# Patient Record
Sex: Female | Born: 1940
Health system: Southern US, Community
[De-identification: ages and names within clinical notes are randomized; demographics above are authoritative.]

## PROBLEM LIST (undated history)

## (undated) DIAGNOSIS — I739 Peripheral vascular disease, unspecified: Secondary | ICD-10-CM

## (undated) DIAGNOSIS — J189 Pneumonia, unspecified organism: Secondary | ICD-10-CM

## (undated) DIAGNOSIS — D219 Benign neoplasm of connective and other soft tissue, unspecified: Secondary | ICD-10-CM

## (undated) DIAGNOSIS — G4730 Sleep apnea, unspecified: Secondary | ICD-10-CM

## (undated) DIAGNOSIS — E559 Vitamin D deficiency, unspecified: Secondary | ICD-10-CM

## (undated) DIAGNOSIS — G4733 Obstructive sleep apnea (adult) (pediatric): Secondary | ICD-10-CM

## (undated) DIAGNOSIS — K5790 Diverticulosis of intestine, part unspecified, without perforation or abscess without bleeding: Secondary | ICD-10-CM

## (undated) DIAGNOSIS — E7800 Pure hypercholesterolemia, unspecified: Secondary | ICD-10-CM

## (undated) DIAGNOSIS — I10 Essential (primary) hypertension: Secondary | ICD-10-CM

## (undated) DIAGNOSIS — M1990 Unspecified osteoarthritis, unspecified site: Secondary | ICD-10-CM

## (undated) DIAGNOSIS — M8580 Other specified disorders of bone density and structure, unspecified site: Secondary | ICD-10-CM

## (undated) DIAGNOSIS — N3010 Interstitial cystitis (chronic) without hematuria: Secondary | ICD-10-CM

## (undated) DIAGNOSIS — I499 Cardiac arrhythmia, unspecified: Secondary | ICD-10-CM

## (undated) HISTORY — DX: Vitamin D deficiency, unspecified: E55.9

## (undated) HISTORY — DX: Other specified disorders of bone density and structure, unspecified site: M85.80

## (undated) HISTORY — PX: CARDIAC ELECTROPHYSIOLOGY STUDY AND ABLATION: SHX1294

## (undated) HISTORY — DX: Sleep apnea, unspecified: G47.30

## (undated) HISTORY — PX: BREAST SURGERY: SHX581

## (undated) HISTORY — PX: OOPHORECTOMY: SHX86

## (undated) HISTORY — PX: EYE SURGERY: SHX253

## (undated) HISTORY — DX: Peripheral vascular disease, unspecified: I73.9

## (undated) HISTORY — PX: HEMORRHOID SURGERY: SHX153

## (undated) HISTORY — PX: APPENDECTOMY: SHX54

## (undated) HISTORY — PX: ABDOMINAL HYSTERECTOMY: SHX81

## (undated) HISTORY — DX: Interstitial cystitis (chronic) without hematuria: N30.10

## (undated) HISTORY — DX: Benign neoplasm of connective and other soft tissue, unspecified: D21.9

## (undated) HISTORY — DX: Diverticulosis of intestine, part unspecified, without perforation or abscess without bleeding: K57.90

## (undated) HISTORY — DX: Obstructive sleep apnea (adult) (pediatric): G47.33

## (undated) HISTORY — PX: TONSILLECTOMY: SHX5217

## (undated) HISTORY — DX: Essential (primary) hypertension: I10

## (undated) HISTORY — DX: Pure hypercholesterolemia, unspecified: E78.00

## (undated) HISTORY — DX: Unspecified osteoarthritis, unspecified site: M19.90

---

## 1997-09-19 ENCOUNTER — Emergency Department (HOSPITAL_COMMUNITY): Admission: EM | Admit: 1997-09-19 | Discharge: 1997-09-19 | Payer: Self-pay | Admitting: Emergency Medicine

## 1997-12-14 ENCOUNTER — Encounter: Payer: Self-pay | Admitting: Emergency Medicine

## 1997-12-14 ENCOUNTER — Emergency Department (HOSPITAL_COMMUNITY): Admission: EM | Admit: 1997-12-14 | Discharge: 1997-12-14 | Payer: Self-pay | Admitting: Emergency Medicine

## 1998-09-15 ENCOUNTER — Other Ambulatory Visit: Admission: RE | Admit: 1998-09-15 | Discharge: 1998-09-15 | Payer: Self-pay | Admitting: Obstetrics and Gynecology

## 1999-02-15 ENCOUNTER — Emergency Department (HOSPITAL_COMMUNITY): Admission: EM | Admit: 1999-02-15 | Discharge: 1999-02-15 | Payer: Self-pay | Admitting: Emergency Medicine

## 2000-01-13 ENCOUNTER — Other Ambulatory Visit: Admission: RE | Admit: 2000-01-13 | Discharge: 2000-01-13 | Payer: Self-pay | Admitting: Obstetrics and Gynecology

## 2001-01-23 ENCOUNTER — Other Ambulatory Visit: Admission: RE | Admit: 2001-01-23 | Discharge: 2001-01-23 | Payer: Self-pay | Admitting: Obstetrics and Gynecology

## 2002-06-27 ENCOUNTER — Emergency Department (HOSPITAL_COMMUNITY): Admission: EM | Admit: 2002-06-27 | Discharge: 2002-06-27 | Payer: Self-pay | Admitting: Emergency Medicine

## 2002-07-01 ENCOUNTER — Emergency Department (HOSPITAL_COMMUNITY): Admission: EM | Admit: 2002-07-01 | Discharge: 2002-07-01 | Payer: Self-pay

## 2002-07-28 ENCOUNTER — Emergency Department (HOSPITAL_COMMUNITY): Admission: EM | Admit: 2002-07-28 | Discharge: 2002-07-28 | Payer: Self-pay | Admitting: Emergency Medicine

## 2002-07-28 ENCOUNTER — Emergency Department (HOSPITAL_COMMUNITY): Admission: EM | Admit: 2002-07-28 | Discharge: 2002-07-28 | Payer: Self-pay | Admitting: *Deleted

## 2002-07-28 ENCOUNTER — Encounter: Payer: Self-pay | Admitting: Emergency Medicine

## 2002-08-06 ENCOUNTER — Ambulatory Visit (HOSPITAL_COMMUNITY): Admission: RE | Admit: 2002-08-06 | Discharge: 2002-08-06 | Payer: Self-pay | Admitting: Cardiology

## 2002-08-06 ENCOUNTER — Encounter: Payer: Self-pay | Admitting: Cardiology

## 2002-08-24 ENCOUNTER — Ambulatory Visit (HOSPITAL_COMMUNITY): Admission: RE | Admit: 2002-08-24 | Discharge: 2002-08-25 | Payer: Self-pay | Admitting: Internal Medicine

## 2002-08-29 ENCOUNTER — Ambulatory Visit (HOSPITAL_COMMUNITY): Admission: RE | Admit: 2002-08-29 | Discharge: 2002-08-29 | Payer: Self-pay | Admitting: Internal Medicine

## 2002-08-29 ENCOUNTER — Encounter: Payer: Self-pay | Admitting: Internal Medicine

## 2002-12-31 ENCOUNTER — Other Ambulatory Visit: Admission: RE | Admit: 2002-12-31 | Discharge: 2002-12-31 | Payer: Self-pay | Admitting: Obstetrics and Gynecology

## 2003-12-31 ENCOUNTER — Ambulatory Visit: Payer: Self-pay | Admitting: Gastroenterology

## 2004-01-06 ENCOUNTER — Other Ambulatory Visit: Admission: RE | Admit: 2004-01-06 | Discharge: 2004-01-06 | Payer: Self-pay | Admitting: Obstetrics and Gynecology

## 2004-01-09 ENCOUNTER — Ambulatory Visit: Payer: Self-pay | Admitting: Gastroenterology

## 2004-11-24 ENCOUNTER — Ambulatory Visit: Payer: Self-pay | Admitting: Pulmonary Disease

## 2005-01-25 ENCOUNTER — Ambulatory Visit: Payer: Self-pay | Admitting: Pulmonary Disease

## 2005-01-26 ENCOUNTER — Ambulatory Visit: Payer: Self-pay | Admitting: Cardiovascular Disease

## 2005-01-29 ENCOUNTER — Ambulatory Visit (HOSPITAL_COMMUNITY): Admission: RE | Admit: 2005-01-29 | Discharge: 2005-01-29 | Payer: Self-pay | Admitting: Urology

## 2005-02-15 ENCOUNTER — Other Ambulatory Visit: Admission: RE | Admit: 2005-02-15 | Discharge: 2005-02-15 | Payer: Self-pay | Admitting: Obstetrics and Gynecology

## 2005-02-17 ENCOUNTER — Ambulatory Visit (HOSPITAL_BASED_OUTPATIENT_CLINIC_OR_DEPARTMENT_OTHER): Admission: RE | Admit: 2005-02-17 | Discharge: 2005-02-17 | Payer: Self-pay | Admitting: Urology

## 2005-02-17 ENCOUNTER — Encounter (INDEPENDENT_AMBULATORY_CARE_PROVIDER_SITE_OTHER): Payer: Self-pay | Admitting: Specialist

## 2005-07-21 ENCOUNTER — Ambulatory Visit: Payer: Self-pay | Admitting: Pulmonary Disease

## 2006-08-29 ENCOUNTER — Other Ambulatory Visit: Admission: RE | Admit: 2006-08-29 | Discharge: 2006-08-29 | Payer: Self-pay | Admitting: Obstetrics and Gynecology

## 2006-09-07 ENCOUNTER — Ambulatory Visit (HOSPITAL_COMMUNITY): Admission: RE | Admit: 2006-09-07 | Discharge: 2006-09-07 | Payer: Self-pay | Admitting: Cardiology

## 2007-04-03 ENCOUNTER — Ambulatory Visit: Payer: Self-pay | Admitting: Pulmonary Disease

## 2007-04-04 DIAGNOSIS — I10 Essential (primary) hypertension: Secondary | ICD-10-CM | POA: Insufficient documentation

## 2007-04-04 DIAGNOSIS — M810 Age-related osteoporosis without current pathological fracture: Secondary | ICD-10-CM | POA: Insufficient documentation

## 2007-04-04 DIAGNOSIS — M1990 Unspecified osteoarthritis, unspecified site: Secondary | ICD-10-CM | POA: Insufficient documentation

## 2007-05-09 ENCOUNTER — Telehealth (INDEPENDENT_AMBULATORY_CARE_PROVIDER_SITE_OTHER): Payer: Self-pay | Admitting: *Deleted

## 2007-08-30 ENCOUNTER — Other Ambulatory Visit: Admission: RE | Admit: 2007-08-30 | Discharge: 2007-08-30 | Payer: Self-pay | Admitting: Obstetrics and Gynecology

## 2007-12-11 ENCOUNTER — Ambulatory Visit: Payer: Self-pay | Admitting: Pulmonary Disease

## 2008-02-12 ENCOUNTER — Telehealth (INDEPENDENT_AMBULATORY_CARE_PROVIDER_SITE_OTHER): Payer: Self-pay | Admitting: *Deleted

## 2008-03-11 ENCOUNTER — Telehealth (INDEPENDENT_AMBULATORY_CARE_PROVIDER_SITE_OTHER): Payer: Self-pay | Admitting: *Deleted

## 2008-03-25 ENCOUNTER — Ambulatory Visit: Payer: Self-pay | Admitting: Obstetrics and Gynecology

## 2008-03-26 ENCOUNTER — Ambulatory Visit: Payer: Self-pay | Admitting: Obstetrics and Gynecology

## 2008-12-13 ENCOUNTER — Encounter (INDEPENDENT_AMBULATORY_CARE_PROVIDER_SITE_OTHER): Payer: Self-pay | Admitting: *Deleted

## 2009-01-08 ENCOUNTER — Encounter (INDEPENDENT_AMBULATORY_CARE_PROVIDER_SITE_OTHER): Payer: Self-pay | Admitting: *Deleted

## 2009-01-29 ENCOUNTER — Encounter (INDEPENDENT_AMBULATORY_CARE_PROVIDER_SITE_OTHER): Payer: Self-pay | Admitting: *Deleted

## 2009-02-04 ENCOUNTER — Ambulatory Visit: Payer: Self-pay | Admitting: Gastroenterology

## 2009-02-13 ENCOUNTER — Ambulatory Visit: Payer: Self-pay | Admitting: Gastroenterology

## 2009-02-20 ENCOUNTER — Ambulatory Visit: Payer: Self-pay | Admitting: Pulmonary Disease

## 2009-10-29 ENCOUNTER — Ambulatory Visit: Payer: Self-pay | Admitting: Pulmonary Disease

## 2009-11-10 ENCOUNTER — Ambulatory Visit: Payer: Self-pay | Admitting: Pulmonary Disease

## 2009-11-10 ENCOUNTER — Telehealth (INDEPENDENT_AMBULATORY_CARE_PROVIDER_SITE_OTHER): Payer: Self-pay | Admitting: *Deleted

## 2009-11-10 DIAGNOSIS — J209 Acute bronchitis, unspecified: Secondary | ICD-10-CM | POA: Insufficient documentation

## 2009-11-10 DIAGNOSIS — N281 Cyst of kidney, acquired: Secondary | ICD-10-CM | POA: Insufficient documentation

## 2009-11-10 DIAGNOSIS — N3010 Interstitial cystitis (chronic) without hematuria: Secondary | ICD-10-CM | POA: Insufficient documentation

## 2009-11-10 DIAGNOSIS — H0520 Unspecified exophthalmos: Secondary | ICD-10-CM | POA: Insufficient documentation

## 2009-11-10 DIAGNOSIS — R059 Cough, unspecified: Secondary | ICD-10-CM | POA: Insufficient documentation

## 2009-11-10 DIAGNOSIS — K5730 Diverticulosis of large intestine without perforation or abscess without bleeding: Secondary | ICD-10-CM | POA: Insufficient documentation

## 2009-11-10 DIAGNOSIS — I498 Other specified cardiac arrhythmias: Secondary | ICD-10-CM | POA: Insufficient documentation

## 2009-11-10 DIAGNOSIS — R05 Cough: Secondary | ICD-10-CM | POA: Insufficient documentation

## 2009-11-10 DIAGNOSIS — M545 Low back pain, unspecified: Secondary | ICD-10-CM | POA: Insufficient documentation

## 2009-11-10 DIAGNOSIS — E7800 Pure hypercholesterolemia, unspecified: Secondary | ICD-10-CM | POA: Insufficient documentation

## 2009-11-12 ENCOUNTER — Telehealth: Payer: Self-pay | Admitting: Pulmonary Disease

## 2009-11-13 LAB — CONVERTED CEMR LAB
AST: 18 units/L (ref 0–37)
BUN: 18 mg/dL (ref 6–23)
CO2: 26 meq/L (ref 19–32)
Calcium: 9 mg/dL (ref 8.4–10.5)
Chloride: 106 meq/L (ref 96–112)
Creatinine, Ser: 1 mg/dL (ref 0.4–1.2)
Eosinophils Absolute: 0.2 10*3/uL (ref 0.0–0.7)
GFR calc non Af Amer: 67.97 mL/min (ref >60)
Hemoglobin: 12.7 g/dL (ref 12.0–15.0)
Lymphocytes Relative: 32.1 % (ref 12.0–46.0)
Lymphs Abs: 2 10*3/uL (ref 0.7–4.0)
MCHC: 34.3 g/dL (ref 30.0–36.0)
MCV: 91.8 fL (ref 78.0–100.0)
Neutro Abs: 3.3 10*3/uL (ref 1.4–7.7)
Neutrophils Relative %: 52.3 % (ref 43.0–77.0)
Potassium: 4.2 meq/L (ref 3.5–5.1)
RBC: 4.03 M/uL (ref 3.87–5.11)
Sed Rate: 25 mm/hr — ABNORMAL HIGH (ref 0–22)
Sodium: 140 meq/L (ref 135–145)
Total Bilirubin: 0.3 mg/dL (ref 0.3–1.2)

## 2010-02-11 ENCOUNTER — Other Ambulatory Visit
Admission: RE | Admit: 2010-02-11 | Discharge: 2010-02-11 | Payer: Self-pay | Source: Home / Self Care | Admitting: Obstetrics and Gynecology

## 2010-02-11 ENCOUNTER — Ambulatory Visit
Admission: RE | Admit: 2010-02-11 | Discharge: 2010-02-11 | Payer: Self-pay | Source: Home / Self Care | Attending: Obstetrics and Gynecology | Admitting: Obstetrics and Gynecology

## 2010-02-28 ENCOUNTER — Encounter: Payer: Self-pay | Admitting: Pulmonary Disease

## 2010-03-12 NOTE — Procedures (Signed)
Summary: Colonoscopy  Patient: Regina [name] Note: All result statuses are Final unless otherwise noted.  Tests: (1) Colonoscopy (COL)   COL Colonoscopy           DONE     [facility]     [address]     [place], [place]  [address]           COLONOSCOPY PROCEDURE REPORT           PATIENT:  [name], [name]  MR#:  [id number]     BIRTHDATE:  1940-07-12, 68 yrs. old  GENDER:  female           ENDOSCOPIST:  Judie Petit T. Russella Dar, MD, [facility]           PROCEDURE DATE:  02/13/2009     PROCEDURE:  Higher-risk screening colonoscopy G0105           ASA CLASS:  Class II     INDICATIONS:  1) Elevated Risk Screening  2) family Hx of polyps:     father and sister with polyps in their 45s.           MEDICATIONS:   Fentanyl 100 mcg IV, Versed 10 mg IV           DESCRIPTION OF PROCEDURE:   After the risks benefits and     alternatives of the procedure were thoroughly explained, informed     consent was obtained.  Digital rectal exam was performed and     revealed no abnormalities.   The LB [id number]-[id number] endoscope     was introduced through the anus and advanced to the cecum, which     was identified by both the appendix and ileocecal valve, without     limitations.  The quality of the prep was excellent, using     MoviPrep.  The instrument was then slowly withdrawn as the colon     was fully examined.     <<PROCEDUREIMAGES>>           FINDINGS:  Mild diverticulosis was found sigmoid to descending     colon.  A normal appearing cecum, ileocecal valve, and appendiceal     orifice were identified. The ascending, hepatic flexure,     transverse, splenic flexure, and rectum appeared unremarkable.     Retroflexed views in the rectum revealed internal hemorrhoids,     small.  The time to cecum =  2.5  minutes. The scope was then     withdrawn (time =  7.5  min) from the patient and the procedure     completed.           COMPLICATIONS:  None           ENDOSCOPIC IMPRESSION:     1)  Mild diverticulosis     2) Internal hemorrhoids           RECOMMENDATIONS:     1) high fiber diet     2) Given your family history, you should have a repeat     colonoscopy in 5 years           Carrah Eppolito T. Russella Dar, MD, [facility]           CC: Michele Mcalpine, MD           n.     Rosalie DoctorVenita Lick. Krithika Tome at 02/13/2009 09:45 AM           [place], [place], [id number]  Note: An exclamation mark Marland Kitchen)  indicates a result that was not dispersed into the flowsheet. Document Creation Date: 02/13/2009 1:00 PM _______________________________________________________________________  (1) Order result status: Final Collection or observation date-time: 02/13/2009 09:40 Requested date-time:  Receipt date-time:  Reported date-time:  Referring Physician:   Ordering Physician: Claudette Head [phone]) Specimen Source:  Source: Launa Grill Order Number: [id number] Lab site:   Appended Document: Colonoscopy    Clinical Lists Changes  Observations: Added new observation of COLONNXTDUE: 02/2014 (02/13/2009 13:10)

## 2010-03-12 NOTE — Assessment & Plan Note (Signed)
Summary: flu shot/mhh  Nurse Visit   Allergies: No Known Drug Allergies  Immunizations Administered:  Influenza Vaccine # 1:    Vaccine Type: Fluvax MCR    Site: left deltoid    Mfr: GlaxoSmithKline    Dose: 0.5 ml    Route: IM    Given by: Kandice Hams CMA    Exp. Date: 08/08/2010    Lot #: [id number]    VIS given: 09/02/09 version given October 29, 2009.  Flu Vaccine Consent Questions:    Do you have a history of severe allergic reactions to this vaccine? no    Any prior history of allergic reactions to egg and/or gelatin? no    Do you have a sensitivity to the preservative Thimersol? no    Do you have a past history of Guillan-Barre Syndrome? no    Do you currently have an acute febrile illness? no    Have you ever had a severe reaction to latex? no    Vaccine information given and explained to patient? yes    Are you currently pregnant? no  Orders Added: 1)  Influenza Vaccine MCR [00025]

## 2010-03-12 NOTE — Progress Notes (Signed)
Summary: side effects    Phone Note Call from Patient Call back at [phone]   Caller: Patient Call For: nadel Summary of Call: pt having side effects from prednisone. Initial call taken by: Rickard Patience,  November 12, 2009 8:14 AM  Follow-up for Phone Call        [facility] at both home and alternate # for pt TCBx1.   Aundra Millet Reynolds LPN  November 12, 2009 9:22 AM   called and spoke with pt.  pt states she was just seen by SN on 11/10/2009.  Pt was given rx for Pred pak and Tussionex.  Pt c/o acid reflux, upset stomach and indigestion since starting the Prednisone.  Pt denied any n/v or diarrhea or constipation.  Pt states she is taking Prednisone without food.  I recommend she try taking prednisone with food and see if that helps Sx but pt still wanted SN's recs.  Please advise.  Thanks.  Aundra Millet Reynolds LPN  November 12, 2009 9:35 AM   Additional Follow-up for Phone Call Additional follow up Details #1::        per SN---please take the prednisone with food.  ok to use the prilosec otc   1 by mouth two times a day 30 mns prior to breakfast and dinner.  thanks Randell Loop CMA  November 12, 2009 2:49 PM   pt advised. Carron Curie CMA  November 12, 2009 3:00 PM

## 2010-03-12 NOTE — Assessment & Plan Note (Signed)
Summary: cough/chest hurts/ok per TD/mg   CC:  4 year ROV & add-on for cough....  History of Present Illness: 70 y/o BF, whom I follow for respiratory problems only as she sees [facility] for Cardiovasc & general medical needs...   ~  November 10, 2009:  she was last seen by me 6/07 for a similar illness/ URI/ AB treated w/ Medrol Dosepak & Tussionex at that time... presents today w/ 2 week hx URI "cold" she says characterized by runny nose, congestion, drainage, cough w/ yellow sputum, +f +c +s> for which she took OTC meds w/ some improvement... but the cough persists- now mostly dry, no sputum, no hemoptysis, & assoc w/ some chest discomfort & sl SOB... we discussed checking CXR (clear) & approp blood work for completeness (all essen WNL) & Rx w/ Depo, Dosepak, Tussionex...    Current Problems:   ACUTE BRONCHITIS (ICD-466.0) & COUGH (ICD-786.2) - ** AS ABOVE **  ~  CXR 10/06 showed clear lungs, normal heart size, WNL.Marland Kitchen.  ~  CXR 10/11 showed clear lungs, NAD...  Hx of PROPTOSIS (ICD-376.30) - she has sl proptosis noted x yrs w/o change... TFT's have been repeatedly WNL.Marland Kitchen. she denies ophthal problems...  HYPERTENSION (ICD-401.9) - she is under the care of DrHarwani & currently taking TOPROL XL 25mg /d, & AMLODIPINE 5mg Bid... BP today= 120/86 and she denies HA, fatigue, visual changes, CP, palipit, dizziness, syncope, dyspnea, edema, etc...   Hx of SUPRAVENTRICULAR TACHYCARDIA (ICD-427.89) - hx recurrent SVT in the past w/ full eval from DrTaylor in 2004 w/ RF catheter ablation of AV nodal reentrant tachycardia 7/04 w/ good result... she has been under the care of [facility] since 2006...  HYPERCHOLESTEROLEMIA (ICD-272.0) - currently taking CRESTOR 20mg /d from [facility] who does her labs as well...   DIVERTICULOSIS OF COLON (ICD-562.10)  ~  colonoscopy 12/05 by DrStark showed divertics, otherw neg... he rec f/u 89yrs due to fam hx polyps.  ~  colonoscopy 1/11 by DrStark showed divertics, &  hems... he rec f/u 65yrs again.  Hx of RENAL CYST (ICD-593.2) & Hx of INTERSTITIAL CYSTITIS (ICD-595.1) - prev eval & Rx by DrPeterson w/ CT/ MRI/ Cysto in 2006-7 & everything appeared benign...   DEGENERATIVE JOINT DISEASE (ICD-715.90) & Hx of BACK PAIN, LUMBAR (ICD-724.2) - prev eval & Rx by drPaul for Ortho, she currently denies back discomfort or signif arthritic limitations...  OSTEOPOROSIS (ICD-733.00) - follwed by DrGottsegen for GYN...   Preventive Screening-Counseling & Management  Alcohol-Tobacco     Smoking Status: never  Allergies (verified): No Known Drug Allergies  Comments:  Nurse/Medical Assistant: The patient's medications and allergies were reviewed with the patient and were updated in the Medication and Allergy Lists.  Past History:  Past Medical History: Hx of PROPTOSIS (ICD-376.30) HYPERTENSION (ICD-401.9) Hx of SUPRAVENTRICULAR TACHYCARDIA (ICD-427.89) HYPERCHOLESTEROLEMIA (ICD-272.0) DIVERTICULOSIS OF COLON (ICD-562.10) Hx of RENAL CYST (ICD-593.2) Hx of INTERSTITIAL CYSTITIS (ICD-595.1) DEGENERATIVE JOINT DISEASE (ICD-715.90) Hx of BACK PAIN, LUMBAR (ICD-724.2) OSTEOPOROSIS (ICD-733.00)  Past Surgical History: S/P appendectomy 1952 S/P T & A 1962 S/P left breast lumpectomy in 1963- benign S/P hysterectomy 1984 S/P hemorroid surg S/P RF catheter ablation for SVT in 2004  Family History: Reviewed history and no changes required. Father died from Myeloma Mother died from stroke 4 Siblings- 1 Bro has DM  Social History: Reviewed history and no changes required. Widow Children Never smoked No alcohol retired Public librarian  Review of Systems       The patient complains of fever, chills, sweats, malaise,  nasal congestion, dyspnea on exertion, cough, wheezing, stiffness, and arthritis.  The patient denies anorexia, fatigue, weakness, weight loss, sleep disorder, blurring, diplopia, eye irritation, eye discharge, vision loss, eye pain,  photophobia, earache, ear discharge, tinnitus, decreased hearing, nosebleeds, sore throat, hoarseness, chest pain, palpitations, syncope, orthopnea, PND, peripheral edema, dyspnea at rest, excessive sputum, hemoptysis, pleurisy, nausea, vomiting, diarrhea, constipation, change in bowel habits, abdominal pain, melena, hematochezia, jaundice, gas/bloating, indigestion/heartburn, dysphagia, odynophagia, dysuria, hematuria, urinary frequency, urinary hesitancy, nocturia, incontinence, back pain, joint pain, joint swelling, muscle cramps, muscle weakness, sciatica, restless legs, leg pain at night, leg pain with exertion, rash, itching, dryness, suspicious lesions, paralysis, paresthesias, seizures, tremors, vertigo, transient blindness, frequent falls, frequent headaches, difficulty walking, depression, anxiety, memory loss, confusion, cold intolerance, heat intolerance, polydipsia, polyphagia, polyuria, unusual weight change, abnormal bruising, bleeding, enlarged lymph nodes, urticaria, allergic rash, hay fever, and recurrent infections.    Vital Signs:  Patient profile:   70 year old female Height:      66 inches Weight:      180.50 pounds BMI:     29.24 O2 Sat:      96 % on Room air Temp:     100.3 degrees F oral Pulse rate:   63 / minute BP sitting:   120 / 86  (right arm) Cuff size:   regular  Vitals Entered By: Randell Loop CMA (November 10, 2009 3:30 PM)  O2 Sat at Rest %:  96 O2 Flow:  Room air CC: 4 year ROV & add-on for cough... Is Patient Diabetic? No Pain Assessment Patient in pain? no      Comments meds updated today with pt   Physical Exam  Additional Exam:  WD, sl overweight, 69 y/o BF in NAD... GENERAL:  Alert & oriented; pleasant & cooperative... HEENT:  [place]/AT, EOM-wnl, PERRLA, EACs-clear, TMs-wnl, NOSE- sl congested, THROAT-clear & wnl. NECK:  Supple w/ fairROM; no JVD; normal carotid impulses w/o bruits; no thyromegaly or nodules palpated; no lymphadenopathy. CHEST:   Clear to P & A x few scat rhonchi, no wheezes/ rales/ consolidation... HEART:  Regular Rhythm;  gr1/6 SEM w/o rubs or gallops detected... ABDOMEN:  Soft & nontender; normal bowel sounds; no organomegaly or masses palpated... EXT: mild arthritic changes; no varicose veins/ +venous insuffic/ no edema. NEURO:  CN's intact; no focal neuro abnormalities... DERM:  No lesions noted; no rash etc...    CXR  Procedure date:  11/10/2009  Findings:      CHEST - 2 VIEW Comparison: Chest x-ray of 11/24/2004   Findings: - The lungs are clear and slightly hyperaerated. Mediastinal contours appear stable.  The heart is within upper limits of normal.  No acute bony abnormality is seen.   IMPRESSION: Stable slight hyperaeration.  No active lung disease.   Read By:  Juline Patch,  M.D.   MISC. Report  Procedure date:  11/10/2009  Findings:      CBC Platelet w/Diff (CBCD)   White Cell Count          6.3 K/uL                    4.5-10.5   Red Cell Count            4.03 Mil/uL                 3.87-5.11   Hemoglobin                12.7 g/dL  12.0-15.0   Hematocrit                37.0 %                      36.0-46.0   MCV                       91.8 fl                     78.0-100.0   Platelet Count            195.0 K/uL                  150.0-400.0   Neutrophil %              52.3 %                      43.0-77.0   Lymphocyte %              32.1 %                      12.0-46.0   Monocyte %                10.2 %                      3.0-12.0   Eosinophils%              2.9 %                       0.0-5.0   Basophils %               2.5 %                       0.0-3.0  BMP (METABOL)   Sodium                    140 mEq/L                   135-145   Potassium                 4.2 mEq/L                   3.5-5.1   Chloride                  106 mEq/L                   96-112   Carbon Dioxide            26 mEq/L                    19-32   Glucose                   90 mg/dL                     16-10   BUN                       18 mg/dL                    9-60   Creatinine  1.0 mg/dL                   1.6-1.0   Calcium                   9.0 mg/dL                   9.6-04.5   GFR                       67.97 mL/min                >60  Hepatic/Liver Function Panel (HEPATIC)   Total Bilirubin           0.3 mg/dL                   4.0-9.8   Direct Bilirubin          0.1 mg/dL                   1.1-9.1   Alkaline Phosphatase      47 U/L                      39-117   AST                       18 U/L                      0-37   ALT                       16 U/L                      0-35   Total Protein             6.6 g/dL                    4.7-8.2   Albumin                   3.9 g/dL                    9.5-6.2  TSH (TSH)   FastTSH                   1.11 uIU/mL                 0.35-5.50  Sed Rate (ESR)   Sed Rate             [H]  25 mm/hr    Impression & Recommendations:  Problem # 1:  ACUTE BRONCHITIS (ICD-466.0) She has asthmatic bronchitis w/ persistant airway inflammation present... we discussed Rx w/ DepoMedrol shot, Prednisone Dosepak & Tussionex for her cough...  Her updated medication list for this problem includes:    Tussionex Pennkinetic Er 10-8 Mg/22ml Lqcr (Hydrocod polst-chlorphen polst) .Marland Kitchen... Take 1 tsp by mouth every 12 h as needed for cough  Orders: T-2 View CXR (71020TC) TLB-CBC Platelet - w/Differential (85025-CBCD) TLB-BMP (Basic Metabolic Panel-BMET) (80048-METABOL) TLB-Hepatic/Liver Function Pnl (80076-HEPATIC) TLB-TSH (Thyroid Stimulating Hormone) (84443-TSH) TLB-Sedimentation Rate (ESR) (85652-ESR) Depo- Medrol 80mg  (J1040) Admin of Therapeutic Inj  intramuscular or subcutaneous (13086)  Problem # 2:  Hx of SUPRAVENTRICULAR TACHYCARDIA (ICD-427.89) She is followed by [facility] for Cards>  HBP, Hx of SVT, Chol, etc... Her updated medication list for  this problem includes:    Toprol Xl 25 Mg Tb24 (Metoprolol succinate) .Marland Kitchen...  Take one tab daily as directed by drharwani  Problem # 3:  HYPERTENSION (ICD-401.9) As above... Her updated medication list for this problem includes:    Toprol Xl 25 Mg Tb24 (Metoprolol succinate) .Marland Kitchen... Take one tab daily as directed by drharwani    Amlodipine Besylate 5 Mg Tabs (Amlodipine besylate) .Marland Kitchen... Take 1 tablet by mouth two times a day as directed by drharwani  Problem # 4:  HYPERCHOLESTEROLEMIA (ICD-272.0) As above... Her updated medication list for this problem includes:    Crestor 20 Mg Tabs (Rosuvastatin calcium) .Marland Kitchen... Take 1 tablet by mouth once a day as directed by drharwani  Problem # 5:  DIVERTICULOSIS OF COLON (ICD-562.10) Followed by DrStark, and colonoscopies are up to date...  Problem # 6:  Hx of INTERSTITIAL CYSTITIS (ICD-595.1) Followed by DrPeterson for Urology...  Problem # 7:  DEGENERATIVE JOINT DISEASE (ICD-715.90) Followed by DrPaul for Ortho...  Problem # 8:  OSTEOPOROSIS (ICD-733.00) Followed by DrGottsegen for GYN...  Complete Medication List: 1)  Toprol Xl 25 Mg Tb24 (Metoprolol succinate) .... Take one tab daily as directed by drharwani 2)  Amlodipine Besylate 5 Mg Tabs (Amlodipine besylate) .... Take 1 tablet by mouth two times a day as directed by drharwani 3)  Crestor 20 Mg Tabs (Rosuvastatin calcium) .... Take 1 tablet by mouth once a day as directed by drharwani 4)  Marine Lipid Concentrate 240-360-5 Mg-mg-unit Caps (Omega-3 fatty acids) .... Once daily 5)  Antioxidant A/c/e Tabs (Multiple vitamin) .... Once daily 6)  [facility] Garlic Odorless Tabs (Garlic) .... Once daily 7)  Prednisone (pak) 5 Mg Tabs (Prednisone) .... Take as directed til gone... 8)  Tussionex Pennkinetic Er 10-8 Mg/24ml Lqcr (Hydrocod polst-chlorphen polst) .... Take 1 tsp by mouth every 12 h as needed for cough  Patient Instructions: 1)  Today we updated your med list- see below.... 2)  We decided to reduce the bronchial tube inflammation w/ the Depo shot & a Pred Dosepak-  take as directed... 3)  You may use the TUSSIONEX cough syrup one tsp every 12 hours as needed... 4)  Today we did a CXR & targeted blood work to further evaluate your cough... please call the "phone tree" in a few days for your lab results.Marland KitchenMarland Kitchen 5)  Call if we can be of assistance to you in any way... Prescriptions: TUSSIONEX PENNKINETIC ER 10-8 MG/5ML LQCR (HYDROCOD POLST-CHLORPHEN POLST) take 1 tsp by mouth every 12 H as needed for cough  #4 oz x 5   Entered and Authorized by:   Michele Mcalpine MD   Signed by:   Michele Mcalpine MD on 11/10/2009   Method used:   Print then Give to Patient   RxID:   [id number] PREDNISONE (PAK) 5 MG TABS (PREDNISONE) take as directed til gone...  #5mg  6d pack x 0   Entered and Authorized by:   Michele Mcalpine MD   Signed by:   Michele Mcalpine MD on 11/10/2009   Method used:   Print then Give to Patient   RxID:   [id number]     Medication Administration  Injection # 1:    Medication: Depo- Medrol 80mg     Diagnosis: ACUTE BRONCHITIS (ICD-466.0)    Route: IM    Site: RUOQ gluteus    Exp Date: 05/2012    Lot #: [id number]    Mfr: [employer]    Patient tolerated injection without complications  Given by: Randell Loop CMA (November 10, 2009 4:51 PM)  Orders Added: 1)  New Patient Level IV [99204] 2)  T-2 View CXR [71020TC] 3)  TLB-CBC Platelet - w/Differential [85025-CBCD] 4)  TLB-BMP (Basic Metabolic Panel-BMET) [80048-METABOL] 5)  TLB-Hepatic/Liver Function Pnl [80076-HEPATIC] 6)  TLB-TSH (Thyroid Stimulating Hormone) [84443-TSH] 7)  TLB-Sedimentation Rate (ESR) [85652-ESR] 8)  Depo- Medrol 80mg  [J1040] 9)  Admin of Therapeutic Inj  intramuscular or subcutaneous [04540]

## 2010-03-12 NOTE — Progress Notes (Signed)
Summary: req to be seen---appt with SN at 3:30pm  Phone Note Call from Patient   Caller: Patient Call For: nadel Summary of Call: pt believes she may have bronchitis. cough/ rattle in chest (non-productive) x 1 wk. no fever. requests to be seen. cell [phone] [facility] [address] Initial call taken by: Tivis Ringer, CNA,  November 10, 2009 8:50 AM  Follow-up for Phone Call        called and spoke with pt.  pt states she thought she had a "cold" but states her cough has worsened and believes she has "bronchitis."  Sx started 1 week ago.  Pt c/o increased non-productive cough.  Pt states chest hurts d/t increased coughing.  Pt denied fever.  Pt last seen in office by TP Feb 2009 and last saw SN June 2007!!   Ok per TD to schedule pt to see SN today at 3:30.  pt aware of appt time today with SN.  Aundra Millet Reynolds LPN  November 10, 2009 9:27 AM

## 2010-03-12 NOTE — Assessment & Plan Note (Signed)
Summary: FLU SHOT/ MBW  Nurse Visit   Allergies: No Known Drug Allergies  Orders Added: 1)  Flu Vaccine 34yrs + [90658] 2)  Administration Flu vaccine - MCR [G0008]  Flu Vaccine Consent Questions     Do you have a history of severe allergic reactions to this vaccine? no    Any prior history of allergic reactions to egg and/or gelatin? no    Do you have a sensitivity to the preservative Thimersol? no    Do you have a past history of Guillan-Barre Syndrome? no    Do you currently have an acute febrile illness? no    Have you ever had a severe reaction to latex? no    Vaccine information given and explained to patient? yes    Are you currently pregnant? no    Lot Number:[id number]   Exp Date:08/07/2009   Site Given  Left Deltoid IM Tammy Scott  February 20, 2009 2:10 PM

## 2010-06-26 NOTE — Discharge Summary (Signed)
NAME:  Regina [name], Regina [name]                       ACCOUNT NO.:  [id number]   MEDICAL RECORD NO.:  [id number]                   PATIENT TYPE:  OIB   LOCATION:  3705                                 FACILITY:  [facility]   PHYSICIAN:  Doylene Canning. Ladona Ridgel, M.D.               DATE OF BIRTH:  Sep 18, 1940   DATE OF ADMISSION:  08/24/2002  DATE OF DISCHARGE:  08/25/2002                           DISCHARGE SUMMARY - REFERRING   DISCHARGE DIAGNOSIS:  Medically refractory supraventricular tachycardia.   SECONDARY DIAGNOSES:  1. Recurrent supraventricular tachycardia, most likely arteriovenous fistula     node reentrance tachycardia versus A-V reentrance tachycardia with     concealed accessory pathway.  2. Hypertension.  3. Hyperlipidemia.   PROCEDURE:  August 24, 2002, electrophysiological study with radiofrequency  ablation of AV node reentrance tachycardia without complication.  The  patient has remained in a sinus rhythm throughout her hospitalization after  undergoing radiofrequency ablation on August 24, 2002.   DISCHARGE DISPOSITION:  Ms. Regina [name] is ready for discharge August 25, 2002.  As mentioned above, she has maintained sinus rhythm during her  hospitalization after radiofrequency ablation.  She is alert and oriented  x3.  Her mental status is clear.  She is not complaining of any pain at the  catheterization sites.  There is no evidence of swelling or erythema.  The  patient is ambulating well, tolerating oral diet.  She will go home with her  preoperative medications, which include:   MEDICATIONS:  1. Zocor 20 mg at bedtime.  2. Enteric-coated aspirin 81 mg daily.  3. Norvasc 5 mg daily.  4. She is to have prophylactic antibiotic therapy before any urinary,     dental, bowel, or gynecological procedure for the next six months.  5. If she does have pain, Tylenol 325 mg, 1-2 tablets every four to six     hours as needed.   DISCHARGE INSTRUCTIONS:  She is asked not to engage in any  heavy lifting or  strenuous activity for the next four days, and she is not to drive for the  next two days.  She is on a low-sodium, low-cholesterol diet.  If she has  any swelling, increased pain at the catheterization site, she is to call  [facility] at [phone].   FOLLOW-UP:  She will have a follow-up with Dr. Ladona Ridgel on October 23, 2002,  at 11:30 in the morning.   BRIEF HISTORY:  Mrs. Regina [name] is a 70 year old female.  She has a  history of hypertension and supraventricular tachycardia which initially  started at age 29.  Since then she has had intermittent episodes with  multiple emergency room visits and infusions of intravenous adenosine.  She  has not been able to stop her supraventricular tachycardia with vagal  maneuvers alone.  They have been reproducibly terminated with adenosine.  Multiple electrocardiograms showed this supraventricular tachycardia.  It is  a narrow, short  R-P tachycardia at a rate of 170 beats a minute.  It starts  suddenly and ends suddenly.  She was seen in consultation by Dr. Lewayne Bunting.  He recommended proceeding with electrophysiological study and  catheter ablation for treatment of medically refractory supraventricular  tachycardia.  She presents on August 24, 2002, for this procedure.  There are  no laboratories for this study, and the postprocedure electrocardiogram  shows sinus bradycardia.  There are no ST changes.     Maple Mirza, P.A.                    Doylene Canning. Ladona Ridgel, M.D.    GM/MEDQ  D:  08/25/2002  T:  08/26/2002  Job:  [id number]   cc:   Eduardo Osier. Sharyn Lull, M.D.  [address]  [place]  [place] [address]  Fax: [phone]   Lonzo Cloud. Kriste Basque, M.D. [facility]    cc:   Eduardo Osier. Sharyn Lull, M.D.  [address]  [place]  [place] [address]  Fax: [phone]   Lonzo Cloud. Kriste Basque, M.D. [facility]

## 2010-06-26 NOTE — Op Note (Signed)
Regina [name], Regina [name]             ACCOUNT NO.:  [id number]   MEDICAL RECORD NO.:  [id number]          PATIENT TYPE:  AMB   LOCATION:  [facility]                         FACILITY:  [facility]   PHYSICIAN:  Maretta Bees. Vonita Moss, M.D.DATE OF BIRTH:  09-09-1940   DATE OF PROCEDURE:  02/17/2005  DATE OF DISCHARGE:                                 OPERATIVE REPORT   PREOPERATIVE DIAGNOSIS:  Rule out interstitial cystitis.   POSTOPERATIVE DIAGNOSIS:  Rule out interstitial cystitis plus urethral  meatal stenosis.   PROCEDURE:  Cystoscopy, urethral dilation, hydraulic overdistention of  bladder, and cold cup bladder biopsy.   [name]:  Dr. Larey Dresser   ANESTHESIA:  General.   INDICATIONS:  This lady was sent to me to evaluate a CT scan that showed a  questionable mass in the upper pole of the right kidney that subsequently an  MRI demonstrated this to probably be cystic, and no solid masses were seen.  She has also had a problem with frequency, nocturia, and bladder pressure  for quite some time, and she and her daughter were counseled about the  possibility of interstitial cystitis, and she comes today for endoscopic  workup in that regard.   PROCEDURE:  The patient is brought to the operating room, placed in  lithotomy position.  External genitalia were prepped and draped in the usual  fashion.  She was noted to have a tight urethral meatus, so she was dilated  from 18 to 30 [place].  She then cystoscoped, and the bladder was perfectly  normal.  She was filled to 550 mL but only after compressing the urethra on  the scope because she was voiding and leaking around the catheter before  then.  After filling the bladder and looking back in, she had small  scattered submucosal petechiae in all 4 four quadrants of the bladder.  Cold  cup bladder biopsies were then obtained from two areas in the bladder base  with these hemorrhages.  The biopsy sites were fulgurated with the Bugbee  electrode.  The  bladder was emptied, the scope removed and the patient sent  to the recovery room in good condition having tolerated the procedure well.      Maretta Bees. Vonita Moss, M.D.  Electronically Signed     LJP/MEDQ  D:  02/17/2005  T:  02/18/2005  Job:  [id number]   cc:   Lonzo Cloud. Kriste Basque, M.D. [facility]  [address]  [place]  [place] [address]

## 2010-06-26 NOTE — Op Note (Signed)
NAME:  Regina [name], Regina [name]                       ACCOUNT NO.:  [id number]   MEDICAL RECORD NO.:  [id number]                   PATIENT TYPE:  OIB   LOCATION:  3705                                 FACILITY:  [facility]   PHYSICIAN:  Doylene Canning. Ladona Ridgel, M.D.               DATE OF BIRTH:  [birth year]   DATE OF PROCEDURE:  08/24/2002  DATE OF DISCHARGE:  08/25/2002                                 OPERATIVE REPORT   PROCEDURE PERFORMED:  Invasive electrophysiologic study with catheter  ablation of atrioventricular node reentry tachycardia.   INTRODUCTION:  The patient is a 70 year old woman with a history of  supraventricular tachycardia for many years. Despite therapy with beta  blockers the patient has had recurrent symptoms requiring multiple  emergency room visits for IV Adenosine infusions. She is now referred for  catheter ablation.   DESCRIPTION OF PROCEDURE:  After informed consent was obtained the patient  was taken to the diagnostic EP laboratory in a fasting state. After the  usual preparation and draping intravenous fentanyl and midazolam was given  for sedation.   A 696 pole catheter was inserted percutaneously into the right jugular vein  and advanced  to the coronary sinus. A 5 French quadripolar catheter was  inserted percutaneously into the right femoral vein and advanced  to the RV  apex. A 5 French quadripolar catheter was inserted percutaneously into the  right femoral vein and advanced to the His bundle region.   After admission with the basic intervals, rapid ventricular pacing was  carried out from the RV apex with a pacing cycle length at 590 milliseconds  and step-wise decreased  down to 340 milliseconds. During rapid ventricular  pacing, the atrial activation sequence was midline  and decremental. At a  pacing cycle length of 340 milliseconds, VA Wenckebach was observed.   Next the programmed ventricular stimulation was carried out from the RV apex  with a base with a  pacing cycle length of 600 milliseconds. The S1, S2  interval was stepwise increased from 540 milliseconds then to 240  milliseconds where ventricular refractoriness  was observed. Again during  programmed ventricular stimulation the atrial activation sequence was  midline  and decremental and there was no inducible SVT.   Next  programmed atrial stimulation was carried out from the coronary sinus  with a pacing cycle length of 540 milliseconds. The S1, S2 intervals were  stepwise decreased  from 540 milliseconds down to 230 milliseconds where  atrial refractoriness was observed. During programmed atrial stimulation,  there were multiple ADH jumps in echo beats including double and even triple  echo beats, but no inducible SVT.   Next rapid atrial pacing was carried out from the high right atrium as well  as the coronary sinus at pacing cycle lengths of 590 milliseconds and  stepwise decreased  down to 380 milliseconds with AV Wenckebach was  observed. During rapid atrial pacing the  PR interval was equal to but not  greater than the R interval, and there was initially no SVT.  Isoproterenol was infused at doses of between 1/2 and 1-1/2 micrograms per  minute.   At approximately 1-1/2 micrograms per minute rapid atrial pacing from the  high right atrium had a pacing cycle length of 290 milliseconds resulted in  the initiation of SVT. This was a narrow QRS tachycardia with a cycle length  of 290 milliseconds. The pacing activation sequence was midline. The AV  interval was essentially 0 with the CSA being activated slightly before the  CSV.  Premature ventricular contractions placed at the time of the His  bundle refractoriness did not pre-excite the atrium and there was a VAV  conduction sequence during SVT. All of the above suggested the diagnosis of  AV node reentry tachycardia.   The ablation catheter was then maneuvered into the usual Koch's triangle.  The orientation was normal.  Three RF energy  applications were delivered to  the slow pathway region. During our finished application, accelerated  junctional rhythm was observed. Following  this isoproterenol was reinfused  at rates of up to 2 micrograms per minute. Additional rapid atrial pacing  resulted in no inducible SVT. There was residual A-H jump and echo beats  noted.   The catheter was then removed. Hemostasis was assured and the patient  returned to her room in satisfactory condition.   COMPLICATIONS:  There were no immediate procedural complications.   RESULTS:  1. Baseline HG:  The baseline HG demonstrates normal sinus rhythm with     normal axis and intervals.  2. Baseline intervals:  The HP interval was 44 milliseconds.  3. Rapid ventricular pacing:  Rapid ventricular pacing from the RV apex     demonstrated a VA Wenckebach cycle length of 340 milliseconds. During     rapid ventricular pacing the __________ sequence was again midline and     decremental.  4. Programmed ventricular stimulation:  Programmed ventricular stimulation     was carried out from the RV apex at a pacing cycle length of 600     milliseconds. The S1, S2 interval was stepwise decreased  from 540     milliseconds down to 240 milliseconds where ventricular refractoriness     was observed. During programmed ventricular stimulation the __________     pacing sequence was again midline and decremental.  5. Rapid atrial pacing:  Rapid atrial pacing was carried out from the high     right atrium as well as the coronary sinus at a pacing cycle length of     490 milliseconds and was stepwise decreased  down to 380 milliseconds     where AV Wenckebach was observed. During isoproterenol infusion rapid     atrial pacing resulted in the initiation of SVT.  6. Programmed atrial stimulation:  Programmed atrial stimulation was carried     out from the coronary sinus at a pacing cycle length of 600 milliseconds.    The S1, S2 interval was  stepwise decreased  from 540 milliseconds down to     230 milliseconds where atrial refractoriness was observed. During     programmed atrial stimulation there were multiple AH jumps and echo beats     including double and even triple echo beats, but no inducible SVT with     programmed atrial stimulation.  7. Arrhythmias observed.  AV node reentrant tachycardia initiation, rapid     atrial pacing during isoproterenol infusion,  duration sustained, cycle     length of 290 milliseconds. Method of termination, rapid ventricular     pacing.  8. Mapping of the patient's SVT demonstrated the usual orientation of Koch's     triangle and midline atrial activation during SVT.  9. RF energy application:  A total of  3 RF energy applications were     delivered to sites 8 and 9 in Koch's 's triangle. During an RF energy     application there was an accelerated junctional rhythm.    CONCLUSION:  The study demonstrates an inducible atrioventricular node of  ventricular tachycardia utilizing isoproterenol. It also demonstrates  successful slow pathway modification with a total of 3 RF energy  applications delivered to Koch's 's triangle. This resulted in rendering of  the tachycardia  noninducible.                                               Doylene Canning. Ladona Ridgel, M.D.    GWT/MEDQ  D:  08/24/2002  T:  08/25/2002  Job:  [id number]   cc:   Lonzo Cloud. Kriste Basque, M.D. [facility]. Sharyn Lull, M.D.  [address]  [place]  [place] [address]  Fax: [phone]   Kathrine Cords, R.N. [facility]

## 2011-01-07 ENCOUNTER — Other Ambulatory Visit: Payer: Self-pay | Admitting: Cardiology

## 2011-01-13 ENCOUNTER — Ambulatory Visit (INDEPENDENT_AMBULATORY_CARE_PROVIDER_SITE_OTHER): Payer: Medicare Other

## 2011-01-13 DIAGNOSIS — Z23 Encounter for immunization: Secondary | ICD-10-CM

## 2011-01-18 ENCOUNTER — Other Ambulatory Visit: Payer: Self-pay | Admitting: *Deleted

## 2011-01-18 DIAGNOSIS — Z139 Encounter for screening, unspecified: Secondary | ICD-10-CM

## 2011-01-20 ENCOUNTER — Other Ambulatory Visit: Payer: Self-pay | Admitting: Obstetrics and Gynecology

## 2011-01-20 DIAGNOSIS — Z139 Encounter for screening, unspecified: Secondary | ICD-10-CM

## 2011-02-22 ENCOUNTER — Telehealth: Payer: Self-pay | Admitting: Pulmonary Disease

## 2011-02-22 NOTE — Telephone Encounter (Signed)
I spoke with the pt and she wants to have the shingles vaccine. I advised that we do not administer the vaccines here that SN writes an rx and she can take it to a pharmacy or health dept. I also advised the pt that she was last seen 11-2009 so I would have to ask if Dr. Kriste Basque would write an rx for this. Pt last seen 11-2009, please advise if ok to give rx for shingles vaccine. Carron Curie, CMA

## 2011-02-22 NOTE — Telephone Encounter (Signed)
rx has been signed by SN and i called and spoke with pt and she is aware that this rx will be left up front for her to pick up.

## 2011-04-09 ENCOUNTER — Encounter: Payer: Self-pay | Admitting: Gynecology

## 2011-04-09 DIAGNOSIS — I739 Peripheral vascular disease, unspecified: Secondary | ICD-10-CM | POA: Insufficient documentation

## 2011-04-09 DIAGNOSIS — D219 Benign neoplasm of connective and other soft tissue, unspecified: Secondary | ICD-10-CM | POA: Insufficient documentation

## 2011-04-09 DIAGNOSIS — G4730 Sleep apnea, unspecified: Secondary | ICD-10-CM | POA: Insufficient documentation

## 2011-04-09 DIAGNOSIS — N3010 Interstitial cystitis (chronic) without hematuria: Secondary | ICD-10-CM | POA: Insufficient documentation

## 2011-04-16 ENCOUNTER — Encounter: Payer: No Typology Code available for payment source | Admitting: Obstetrics and Gynecology

## 2011-04-19 ENCOUNTER — Encounter: Payer: Self-pay | Admitting: Obstetrics and Gynecology

## 2011-04-19 ENCOUNTER — Ambulatory Visit (INDEPENDENT_AMBULATORY_CARE_PROVIDER_SITE_OTHER): Payer: Medicare Other | Admitting: Obstetrics and Gynecology

## 2011-04-19 VITALS — BP 124/80 | Ht 66.0 in | Wt 183.0 lb

## 2011-04-19 DIAGNOSIS — M8580 Other specified disorders of bone density and structure, unspecified site: Secondary | ICD-10-CM

## 2011-04-19 DIAGNOSIS — N8111 Cystocele, midline: Secondary | ICD-10-CM

## 2011-04-19 DIAGNOSIS — M899 Disorder of bone, unspecified: Secondary | ICD-10-CM

## 2011-04-19 DIAGNOSIS — I10 Essential (primary) hypertension: Secondary | ICD-10-CM | POA: Insufficient documentation

## 2011-04-19 DIAGNOSIS — IMO0002 Reserved for concepts with insufficient information to code with codable children: Secondary | ICD-10-CM

## 2011-04-19 DIAGNOSIS — M949 Disorder of cartilage, unspecified: Secondary | ICD-10-CM

## 2011-04-19 DIAGNOSIS — E7800 Pure hypercholesterolemia, unspecified: Secondary | ICD-10-CM | POA: Insufficient documentation

## 2011-04-19 DIAGNOSIS — N3010 Interstitial cystitis (chronic) without hematuria: Secondary | ICD-10-CM

## 2011-04-19 DIAGNOSIS — R350 Frequency of micturition: Secondary | ICD-10-CM

## 2011-04-19 DIAGNOSIS — N644 Mastodynia: Secondary | ICD-10-CM

## 2011-04-19 NOTE — Patient Instructions (Signed)
Get a followup bone density. Call me if breast tenderness persists.

## 2011-04-19 NOTE — Progress Notes (Signed)
The patient came to see me today for further followup. She has a new symptoms which is right breast pain at 12:00. She's had it for less than a month. She does not take hormones. She just had a mammogram in December. She does have calcifications in the breast and is scheduled for followup mammogram in June. She previously had a breast biopsy at almost the same spot. She has a history of interstitial cystitis but since Dr. Shiela Mayer instilled DMSO she has been fine. She has nocturia x3. She has no urgency. She has no dysuria. She is having no pelvic pain. She does have osteopenia and is due for followup bone density. She's had no fractures. She takes calcium and vitamin D.  ROS: 12 system review done. Pertinent positives above. Other positives include supraventricular tachycardia, hyperlipidemia, hypertension, PAD, cyst and sleep apnea.  HEENT: Within normal limits. Kim gardner present Neck: No masses. Supraclavicular lymph nodes: Not enlarged. Breasts: Examined in both sitting and lying position. Symmetrical without skin changes masses, or tenderness Abdomen: Soft no masses guarding or rebound. No hernias. Pelvic: External within normal limits. BUS within normal limits. Vaginal examination shows good estrogen effect, second-degree cystocele. Cervix and uterus absent. Adnexa within normal limits. Rectovaginal confirmatory. Extremities within normal limits.  Assessment: Mastodynia right breast above the nipple. Osteopenia. Cystocele. Nocturia. Interstitial cystitis.  Plan: Vitamin E 400 mg daily. Call me if tenderness persists. Mammogram in June. Observation of other symptoms. Followup bone density.

## 2011-04-20 ENCOUNTER — Telehealth: Payer: Self-pay | Admitting: Pulmonary Disease

## 2011-04-20 LAB — URINALYSIS W MICROSCOPIC + REFLEX CULTURE
Glucose, UA: NEGATIVE mg/dL
Hgb urine dipstick: NEGATIVE
Protein, ur: NEGATIVE mg/dL
Urobilinogen, UA: 0.2 mg/dL (ref 0.0–1.0)

## 2011-04-20 NOTE — Telephone Encounter (Signed)
Pt c/o non prod cough for 2 days with some chest tightness.  Pt denies chest congestion, fever or SOB.  Pt has not tried anything otc.  Pt instucted to try Mucinex Dm or Delsym for cough and if symptoms do not improve or if starting to have prod cough with colored sputum to call out office back for futher recommendations.

## 2011-04-21 LAB — URINE CULTURE
Colony Count: NO GROWTH
Organism ID, Bacteria: NO GROWTH

## 2011-04-23 ENCOUNTER — Telehealth: Payer: Self-pay | Admitting: Pulmonary Disease

## 2011-04-23 NOTE — Telephone Encounter (Signed)
Spoke with pt. She is c/o chest congestion, chest tightness, increased SOB and cough. I advised SN out of the office today and no openings with TP. She was last seen in 2011 so I advised she should go to [facility] for eval. I advised needs to schedule ov with SN since not seen in so long. I have scheduled her for 06-07-11

## 2011-04-27 ENCOUNTER — Encounter: Payer: No Typology Code available for payment source | Admitting: Obstetrics and Gynecology

## 2011-06-07 ENCOUNTER — Ambulatory Visit: Payer: Medicare Other | Admitting: Pulmonary Disease

## 2011-07-08 ENCOUNTER — Other Ambulatory Visit: Payer: Self-pay | Admitting: *Deleted

## 2011-07-08 DIAGNOSIS — R921 Mammographic calcification found on diagnostic imaging of breast: Secondary | ICD-10-CM

## 2011-07-26 ENCOUNTER — Encounter: Payer: Self-pay | Admitting: Obstetrics and Gynecology

## 2012-02-23 ENCOUNTER — Other Ambulatory Visit: Payer: Self-pay | Admitting: *Deleted

## 2012-02-23 ENCOUNTER — Telehealth: Payer: Self-pay | Admitting: *Deleted

## 2012-02-23 DIAGNOSIS — R921 Mammographic calcification found on diagnostic imaging of breast: Secondary | ICD-10-CM

## 2012-02-23 NOTE — Telephone Encounter (Signed)
Spoke with patient about the letter we received from [facility] requesting close interval evaluation that was recommended. Pt said she will contact [facility] regarding this.

## 2012-02-28 ENCOUNTER — Other Ambulatory Visit: Payer: Self-pay | Admitting: Gynecology

## 2012-02-28 DIAGNOSIS — R921 Mammographic calcification found on diagnostic imaging of breast: Secondary | ICD-10-CM

## 2012-03-06 ENCOUNTER — Other Ambulatory Visit: Payer: Self-pay | Admitting: *Deleted

## 2012-03-06 DIAGNOSIS — R921 Mammographic calcification found on diagnostic imaging of breast: Secondary | ICD-10-CM

## 2012-05-22 ENCOUNTER — Ambulatory Visit (INDEPENDENT_AMBULATORY_CARE_PROVIDER_SITE_OTHER): Payer: Medicare Other | Admitting: Gynecology

## 2012-05-22 ENCOUNTER — Encounter: Payer: Self-pay | Admitting: Gynecology

## 2012-05-22 VITALS — BP 136/80 | Ht 65.0 in | Wt 181.0 lb

## 2012-05-22 DIAGNOSIS — M899 Disorder of bone, unspecified: Secondary | ICD-10-CM

## 2012-05-22 DIAGNOSIS — M949 Disorder of cartilage, unspecified: Secondary | ICD-10-CM

## 2012-05-22 DIAGNOSIS — M8580 Other specified disorders of bone density and structure, unspecified site: Secondary | ICD-10-CM

## 2012-05-22 DIAGNOSIS — Z780 Asymptomatic menopausal state: Secondary | ICD-10-CM

## 2012-05-22 NOTE — Patient Instructions (Addendum)
Tetanus, Diphtheria, Pertussis (Tdap) Vaccine What You Need to Know WHY GET VACCINATED? Tetanus, diphtheria and pertussis can be very serious diseases, even for adolescents and adults. Tdap vaccine can protect us from these diseases. TETANUS (Lockjaw) causes painful muscle tightening and stiffness, usually all over the body.  It can lead to tightening of muscles in the head and neck so you can't open your mouth, swallow, or sometimes even breathe. Tetanus kills about 1 out of 5 people who are infected. DIPHTHERIA can cause a thick coating to form in the back of the throat.  It can lead to breathing problems, paralysis, heart failure, and death. PERTUSSIS (Whooping Cough) causes severe coughing spells, which can cause difficulty breathing, vomiting and disturbed sleep.  It can also lead to weight loss, incontinence, and rib fractures. Up to 2 in 100 adolescents and 5 in 100 adults with pertussis are hospitalized or have complications, which could include pneumonia and death. These diseases are caused by bacteria. Diphtheria and pertussis are spread from person to person through coughing or sneezing. Tetanus enters the body through cuts, scratches, or wounds. Before vaccines, the [place] saw as many as 200,000 cases a year of diphtheria and pertussis, and hundreds of cases of tetanus. Since vaccination began, tetanus and diphtheria have dropped by about 99% and pertussis by about 80%. TDAP VACCINE Tdap vaccine can protect adolescents and adults from tetanus, diphtheria, and pertussis. One dose of Tdap is routinely given at age 11 or 12. People who did not get Tdap at that age should get it as soon as possible. Tdap is especially important for health care professionals and anyone having close contact with a baby younger than 12 months. Pregnant women should get a dose of Tdap during every pregnancy, to protect the newborn from pertussis. Infants are most at risk for severe, life-threatening  complications from pertussis. A similar vaccine, called Td, protects from tetanus and diphtheria, but not pertussis. A Td booster should be given every 10 years. Tdap may be given as one of these boosters if you have not already gotten a dose. Tdap may also be given after a severe cut or burn to prevent tetanus infection. Your doctor can give you more information. Tdap may safely be given at the same time as other vaccines. SOME PEOPLE SHOULD NOT GET THIS VACCINE  If you ever had a life-threatening allergic reaction after a dose of any tetanus, diphtheria, or pertussis containing vaccine, OR if you have a severe allergy to any part of this vaccine, you should not get Tdap. Tell your doctor if you have any severe allergies.  If you had a coma, or long or multiple seizures within 7 days after a childhood dose of DTP or DTaP, you should not get Tdap, unless a cause other than the vaccine was found. You can still get Td.  Talk to your doctor if you:  have epilepsy or another nervous system problem,  had severe pain or swelling after any vaccine containing diphtheria, tetanus or pertussis,  ever had Guillain-Barr Syndrome (GBS),  aren't feeling well on the day the shot is scheduled. RISKS OF A VACCINE REACTION With any medicine, including vaccines, there is a chance of side effects. These are usually mild and go away on their own, but serious reactions are also possible. Brief fainting spells can follow a vaccination, leading to injuries from falling. Sitting or lying down for about 15 minutes can help prevent these. Tell your doctor if you feel dizzy or light-headed, or   have vision changes or ringing in the ears. Mild problems following Tdap (Did not interfere with activities)  Pain where the shot was given (about 3 in 4 adolescents or 2 in 3 adults)  Redness or swelling where the shot was given (about 1 person in 5)  Mild fever of at least 100.52F (up to about 1 in 25 adolescents or 1 in  100 adults)  Headache (about 3 or 4 people in 10)  Tiredness (about 1 person in 3 or 4)  Nausea, vomiting, diarrhea, stomach ache (up to 1 in 4 adolescents or 1 in 10 adults)  Chills, body aches, sore joints, rash, swollen glands (uncommon) Moderate problems following Tdap (Interfered with activities, but did not require medical attention)  Pain where the shot was given (about 1 in 5 adolescents or 1 in 100 adults)  Redness or swelling where the shot was given (up to about 1 in 16 adolescents or 1 in 25 adults)  Fever over 102F (about 1 in 100 adolescents or 1 in 250 adults)  Headache (about 3 in 20 adolescents or 1 in 10 adults)  Nausea, vomiting, diarrhea, stomach ache (up to 1 or 3 people in 100)  Swelling of the entire arm where the shot was given (up to about 3 in 100). Severe problems following Tdap (Unable to perform usual activities, required medical attention)  Swelling, severe pain, bleeding and redness in the arm where the shot was given (rare). A severe allergic reaction could occur after any vaccine (estimated less than 1 in a million doses). WHAT IF THERE IS A SERIOUS REACTION? What should I look for?  Look for anything that concerns you, such as signs of a severe allergic reaction, very high fever, or behavior changes. Signs of a severe allergic reaction can include hives, swelling of the face and throat, difficulty breathing, a fast heartbeat, dizziness, and weakness. These would start a few minutes to a few hours after the vaccination. What should I do?  If you think it is a severe allergic reaction or other emergency that can't wait, call 9-1-1 or get the person to the nearest hospital. Otherwise, call your doctor.  Afterward, the reaction should be reported to the "Vaccine Adverse Event Reporting System" (VAERS). Your doctor might file this report, or you can do it yourself through the VAERS web site at www.vaers.LAgents.no, or by calling 1-705-370-6070. VAERS is  only for reporting reactions. They do not give medical advice.  THE NATIONAL VACCINE INJURY COMPENSATION PROGRAM The National Vaccine Injury Compensation Program (VICP) is a federal program that was created to compensate people who may have been injured by certain vaccines. Persons who believe they may have been injured by a vaccine can learn about the program and about filing a claim by calling 1-930-379-7943 or visiting the VICP website at SpiritualWord.at. HOW CAN I LEARN MORE?  Ask your doctor.  Call your local or state health department.  Contact the Centers for Disease Control and Prevention (CDC):  Call 970 825 7716 or visit CDC's website at PicCapture.uy CDC Tdap Vaccine VIS (06/17/11) Document Released: 07/27/2011 Document Reviewed: 07/27/2011 [facility] Patient Information 2013 [place], [place]. BRCA-1 and BRCA-2 BRCA-1 and BRCA-2 are 2 genes that are linked with hereditary breast and ovarian cancers. About 200,000 women are diagnosed with invasive breast cancer each year and about 23,000 with ovarian cancer (according to the American Cancer Society). Of these cancers, about 5% to 10% will be due to a mutation in one of the BRCA genes. Men can also inherit  an increased risk of developing breast cancer, primarily from an alteration in the BRCA-2 gene.  Individuals with mutations in BRCA1 or BRCA2 have significantly elevated risks for breast cancer (up to 80% lifetime risk), ovarian cancer (up to 40% lifetime risk), bilateral breast cancer and other types of cancers. BRCA mutations are inherited and passed from generation to generation. One half of the time, they are passed from the father's side of the family.  The DNA in white blood cells is used to detect mutations in the BRCA genes. While the gene products (proteins) of the BRCA genes act only in breast and ovarian tissue, the genes are present in every cell of the body and blood is the most easily accessible  source of that DNA. PREPARATION FOR TEST The test for BRCA mutations is done on a blood sample collected by needle from a vein in the arm. The test does not require surgical biopsy of breast or ovarian tissue.  NORMAL FINDINGS No genetic mutations. Ranges for normal findings may vary among different laboratories and hospitals. You should always check with your doctor after having lab work or other tests done to discuss the meaning of your test results and whether your values are considered within normal limits. MEANING OF TEST  Your caregiver will go over the test results with you and discuss the importance and meaning of your results, as well as treatment options and the need for additional tests if necessary. OBTAINING THE TEST RESULTS It is your responsibility to obtain your test results. Ask the lab or department performing the test when and how you will get your results. OTHER THINGS TO KNOW Your test results may have implications for other family members. When one member of a family is tested for BRCA mutations, issues often arise about how or whether to share this information with other family members. Seek advice from a genetic counselor about communication of result with your family members.  Pre and post test consultation with a health care provider knowledgeable about genetic testing cannot be overemphasized.  There are many issues to be considered when preparing for a genetic test and upon learning the results, and a genetic counselor has the knowledge and experience to help you sort through them.  If the BRCA test is positive, the options include increased frequency of check-ups (e.g., mammography, blood tests for CA-125, or transvaginal ultrasonography); medications that could reduce risk (e.g., oral contraceptives or tamoxifen); or surgical removal of the ovaries or breasts. There are a number of variables involved and it is important to discuss your options with your doctor and genetic  counselor. Research studies have reported that for every 1000 women negative for BRCA mutations, between 12 and 45 of them will develop breast cancer by age 35 and between 3 and 4 will develop ovarian cancer by age 82. The risk increases with age. The test can be ordered by a doctor, preferably by one who can also offer genetic counseling. The blood sample will be sent to a laboratory that specializes in BRCA testing. The American Society of Clinical Oncology and the National Breast Cancer Coalition encourage women seeking the test to participate in long-term outcome studies to help gather information on the effectiveness of different check-up and treatment options. Document Released: 02/19/2004 Document Revised: 04/19/2011 Document Reviewed: 01/01/2008 [facility] Patient Information 2013 [place], [place].

## 2012-05-22 NOTE — Progress Notes (Signed)
Regina [name] 1940/04/04 [id number]   History:    72 y.o.  for gynecological exam. Review of patient's records indicated she has history of osteopenia and her last bone density study was in 2007. She had a mammogram in January this year which was normal. She does not frequently do her breast exam. She had a normal colonoscopy in 2011. Review of her record indicated that she had a total abdominal hysterectomy with left salpingo-oophorectomy in 1982 secondary to uterine fibroids. Patient is on no hormone replacement therapy. Patient denies any prior history of dysplasia. Last Pap smear reported being 2012. Patient states that her Pneumovax vaccine is up-to-date. She has not had her shingles are the Tdap. Patient informed me that her daughter 61 years of age was recently diagnosed with stage III ovarian cancer.Patient's primary physician is Dr. Laurann Montana who has been treating her for hyperlipidemia and hypertension. She has a history of interstitial cystitis but since Dr. Shiela Mayer instilled DMSO she has been fine. She has nocturia x3. She has no urgency. She has no dysuria. She is having no pelvic pain.    Past medical history,surgical history, family history and social history were all reviewed and documented in the EPIC chart.  Gynecologic History No LMP recorded. Patient has had a hysterectomy. Contraception: post menopausal status Last Pap: 2012. Results were: normal Last mammogram: 2014. Results were: normal  Obstetric History OB History   Grav Para Term Preterm Abortions TAB SAB Ect Mult Living   2 2 2       2      # Outc Date GA Lbr Len/2nd Wgt Sex Del Anes PTL Lv   1 TRM            2 TRM                ROS: A ROS was performed and pertinent positives and negatives are included in the history.  GENERAL: No fevers or chills. HEENT: No change in vision, no earache, sore throat or sinus congestion. NECK: No pain or stiffness. CARDIOVASCULAR: No chest pain or pressure. No  palpitations. PULMONARY: No shortness of breath, cough or wheeze. GASTROINTESTINAL: No abdominal pain, nausea, vomiting or diarrhea, melena or bright red blood per rectum. GENITOURINARY: No urinary frequency, urgency, hesitancy or dysuria. MUSCULOSKELETAL: No joint or muscle pain, no back pain, no recent trauma. DERMATOLOGIC: No rash, no itching, no lesions. ENDOCRINE: No polyuria, polydipsia, no heat or cold intolerance. No recent change in weight. HEMATOLOGICAL: No anemia or easy bruising or bleeding. NEUROLOGIC: No headache, seizures, numbness, tingling or weakness. PSYCHIATRIC: No depression, no loss of interest in normal activity or change in sleep pattern.    -12 system review done. Pertinent positives above. Other positives include supraventricular tachycardia, hyperlipidemia, hypertension, PAD, cyst and sleep apnea.  Exam: chaperone present  BP 136/80  Ht 5\' 5"  (1.651 m)  Wt 181 lb (82.101 kg)  BMI 30.12 kg/m2  Body mass index is 30.12 kg/(m^2).  General appearance : Well developed well nourished female. No acute distress HEENT: Neck supple, trachea midline, no carotid bruits, no thyroidmegaly Lungs: Clear to auscultation, no rhonchi or wheezes, or rib retractions  Heart: Regular rate and rhythm, no murmurs or gallops Breast:Examined in sitting and supine position were symmetrical in appearance, no palpable masses or tenderness,  no skin retraction, no nipple inversion, no nipple discharge, no skin discoloration, no axillary or supraclavicular lymphadenopathy Abdomen: no palpable masses or tenderness, no rebound or guarding Extremities: no edema or skin discoloration or  tenderness  Pelvic:  Bartholin, Urethra, Skene Glands: Within normal limits             Vagina: No gross lesions or discharge, vaginal atrophy  Cervix: No gross lesions or discharge  Uterus  anteverted, normal size, shape and consistency, non-tender and mobile  Adnexa  Without masses or tenderness  Anus and  perineum  normal   Rectovaginal  normal sphincter tone without palpated masses or tenderness             Hemoccult cards provided   Tdap vaccine administered. Prescription for shingles vaccine provided.  Assessment/Plan:  72 y.o. female who is overdue for bone density study was given a requisition to schedule. She was given Hemoccult card system it to the office for testing. We discussed importance of calcium and vitamin D and regular exercise for osteoporosis prevention. She is going to check with her daughter to see if she was tested for the BRCA1 and BRCA2 gene mutation. She will then return to sit down on top of need to see if we may need to schedule her for testing as well. I have recommended that her daughter be tested as well. No Pap smear done today new guidelines discussed.    Ok Edwards MD, 3:43 PM 05/22/2012

## 2012-05-25 ENCOUNTER — Encounter: Payer: Self-pay | Admitting: Obstetrics and Gynecology

## 2012-08-01 ENCOUNTER — Other Ambulatory Visit: Payer: Self-pay | Admitting: *Deleted

## 2012-08-31 ENCOUNTER — Other Ambulatory Visit: Payer: Self-pay | Admitting: *Deleted

## 2012-09-01 ENCOUNTER — Encounter: Payer: Self-pay | Admitting: Gynecology

## 2012-09-06 ENCOUNTER — Encounter: Payer: Self-pay | Admitting: Gynecology

## 2012-09-06 ENCOUNTER — Other Ambulatory Visit: Payer: Self-pay | Admitting: Radiology

## 2012-09-13 ENCOUNTER — Encounter: Payer: Self-pay | Admitting: Gynecology

## 2012-09-25 ENCOUNTER — Ambulatory Visit (INDEPENDENT_AMBULATORY_CARE_PROVIDER_SITE_OTHER): Payer: Medicare Other | Admitting: Surgery

## 2012-09-25 ENCOUNTER — Encounter (INDEPENDENT_AMBULATORY_CARE_PROVIDER_SITE_OTHER): Payer: Self-pay | Admitting: Surgery

## 2012-09-25 VITALS — BP 127/77 | HR 66 | Temp 97.7°F | Resp 14 | Ht 65.0 in | Wt 181.0 lb

## 2012-09-25 DIAGNOSIS — D241 Benign neoplasm of right breast: Secondary | ICD-10-CM | POA: Insufficient documentation

## 2012-09-25 DIAGNOSIS — D249 Benign neoplasm of unspecified breast: Secondary | ICD-10-CM

## 2012-09-25 NOTE — Progress Notes (Signed)
Patient ID: Regina [name], female   DOB: [birth year], 73 y.o.   MRN: [id number]  Chief Complaint  Patient presents with  . Other    right breast papilloma    HPI Regina [name] is a 72 y.o. female.   HPI This is a pleasant female referred by Dr. Yolanda Bonine for abnormal microcalcifications and a papilloma in the right breast found on recent mammography and biopsy. She's had previous benign biopsies and lumpectomies on the right breast in the past. She is currently without complaints. She denies nipple discharge. She tolerated the biopsies well. She is doing well from a cardiopulmonary standpoint. Past Medical History  Diagnosis Date  . IC (interstitial cystitis)   . Fibroid   . Apnea, sleep   . PAD (peripheral artery disease)   . Hypertension   . Elevated cholesterol   . Arthritis     Past Surgical History  Procedure Laterality Date  . Abdominal hysterectomy      TAH LSO  . Appendectomy    . Cardiac electrophysiology study and ablation    . Breast surgery      Lumpectomy-Benign  . Breast surgery      Biopsy  . Oophorectomy      LSO  . Tonsillectomy    . Hemorrhoid surgery      Family History  Problem Relation Age of Onset  . Diabetes Mother   . Hypertension Mother   . Stroke Mother   . Cancer Father     Multiple Myloma  . Cancer Daughter 67    OVARIAN CANCER STAGE 3     Social History History  Substance Use Topics  . Smoking status: Never Smoker   . Smokeless tobacco: Never Used  . Alcohol Use: No    Allergies  Allergen Reactions  . Other     MYCIN drugs    Current Outpatient Prescriptions  Medication Sig Dispense Refill  . AmLODIPine Besylate (NORVASC PO) Take by mouth.      Marland Kitchen aspirin 81 MG tablet Take 81 mg by mouth daily.      Marland Kitchen BIOTIN FORTE PO Take by mouth.      . calcium citrate (CALCITRATE - DOSED IN MG ELEMENTAL CALCIUM) 950 MG tablet Take 1 tablet by mouth daily.      . fish oil-omega-3 fatty acids 1000 MG capsule Take 1 g by mouth  daily.      . Metoprolol Tartrate (LOPRESSOR PO) Take by mouth.      . Multiple Vitamin (MULTIVITAMIN) tablet Take 1 tablet by mouth daily.      . Rosuvastatin Calcium (CRESTOR PO) Take by mouth.      . vitamin E 1000 UNIT capsule Take 1,000 Units by mouth daily.      . [DISCONTINUED] ATENOLOL PO Take by mouth.      . [DISCONTINUED] Calcium Carbonate-Vitamin D (CALCIUM + D PO) Take by mouth.       No current facility-administered medications for this visit.    Review of Systems Review of Systems  Constitutional: Negative for fever, chills and unexpected weight change.  HENT: Negative for hearing loss, congestion, sore throat, trouble swallowing and voice change.   Eyes: Negative for visual disturbance.  Respiratory: Negative for cough and wheezing.   Cardiovascular: Negative for chest pain, palpitations and leg swelling.  Gastrointestinal: Negative for nausea, vomiting, abdominal pain, diarrhea, constipation, blood in stool, abdominal distention and anal bleeding.  Genitourinary: Negative for hematuria, vaginal bleeding and difficulty urinating.  Musculoskeletal: Negative for  arthralgias.  Skin: Negative for rash and wound.  Neurological: Negative for seizures, syncope and headaches.  Hematological: Negative for adenopathy. Does not bruise/bleed easily.  Psychiatric/Behavioral: Negative for confusion.    Blood pressure 127/77, pulse 66, temperature 97.7 F (36.5 C), temperature source Temporal, resp. rate 14, height 5\' 5"  (1.651 m), weight 181 lb (82.101 kg).  Physical Exam Physical Exam  Constitutional: She is oriented to person, place, and time. She appears well-developed and well-nourished.  HENT:  Head: Normocephalic and atraumatic.  Right Ear: External ear normal.  Left Ear: External ear normal.  Nose: Nose normal.  Mouth/Throat: Oropharynx is clear and moist. No oropharyngeal exudate.  Eyes: Conjunctivae are normal. Pupils are equal, round, and reactive to light. Right  eye exhibits no discharge. Left eye exhibits no discharge. No scleral icterus.  Neck: Normal range of motion. Neck supple. No tracheal deviation present. No thyromegaly present.  Cardiovascular: Normal rate, regular rhythm, normal heart sounds and intact distal pulses.   No murmur heard. Pulmonary/Chest: Effort normal and breath sounds normal. No respiratory distress. She has no wheezes.  Musculoskeletal: Normal range of motion. She exhibits no edema and no tenderness.  Lymphadenopathy:    She has no cervical adenopathy.    She has no axillary adenopathy.  Neurological: She is alert and oriented to person, place, and time.  Skin: Skin is warm and dry. No rash noted. No erythema.  Psychiatric: Her behavior is normal. Judgment normal.   Breasts: There is a well-healed scar at 3:00 position of the right breast. A stereotactic biopsy site is at the 12:00 position. There are no palpable masses. Arreola is normal  Data Reviewed I have reviewed the mammograms showing the abnormal microcalcifications in the right breast. I have reviewed the pathology confirming an intraductal papilloma  Assessment    Intraductal papilloma of the right breast     Plan    Needle localized lumpectomy is recommended to rule out malignancy. I have discussed this with the patient in detail. I have discussed the surgical procedure. I discussed the risks which includes but is not limited to bleeding, infection, need for further surgery, et Karie Soda. She understands and wishes to proceed. Surgery will be scheduled        Cilicia Borden A 09/25/2012, 10:05 AM

## 2012-10-17 ENCOUNTER — Ambulatory Visit
Admission: RE | Admit: 2012-10-17 | Discharge: 2012-10-17 | Disposition: A | Discharge status: Home / Self Care | Payer: Medicare Other | Source: Ambulatory Visit | Attending: Physical Medicine and Rehabilitation | Admitting: Physical Medicine and Rehabilitation

## 2012-10-17 ENCOUNTER — Other Ambulatory Visit: Payer: Self-pay | Admitting: Physical Medicine and Rehabilitation

## 2012-10-17 DIAGNOSIS — M545 Low back pain, unspecified: Secondary | ICD-10-CM

## 2012-10-17 DIAGNOSIS — M47817 Spondylosis without myelopathy or radiculopathy, lumbosacral region: Secondary | ICD-10-CM

## 2012-10-17 DIAGNOSIS — M25561 Pain in right knee: Secondary | ICD-10-CM

## 2012-10-17 DIAGNOSIS — M25562 Pain in left knee: Secondary | ICD-10-CM

## 2012-11-13 NOTE — Pre-Procedure Instructions (Signed)
Regina [name]  11/13/2012   Your procedure is scheduled on:  11/22/12  Report to [facility]  2 * 3 at 730 AM.  Call this number if you have problems the morning of surgery: [phone]   Remember:   Do not eat food or drink liquids after midnight.   Take these medicines the morning of surgery with A SIP OF WATER: norvasc,lopressor   Do not wear jewelry, make-up or nail polish.  Do not wear lotions, powders, or perfumes. You may wear deodorant.  Do not shave 48 hours prior to surgery. Men may shave face and neck.  Do not bring valuables to the hospital.  [facility] is not responsible                  for any belongings or valuables.               Contacts, dentures or bridgework may not be worn into surgery.  Leave suitcase in the car. After surgery it may be brought to your room.  For patients admitted to the hospital, discharge time is determined by your                treatment team.               Patients discharged the day of surgery will not be allowed to drive  home.  Name and phone number of your driver: family  Special Instructions: Shower using CHG 2 nights before surgery and the night before surgery.  If you shower the day of surgery use CHG.  Use special wash - you have one bottle of CHG for all showers.  You should use approximately 1/3 of the bottle for each shower.   Please read over the following fact sheets that you were given: Pain Booklet, Coughing and Deep Breathing and Surgical Site Infection Prevention

## 2012-11-14 ENCOUNTER — Inpatient Hospital Stay (HOSPITAL_COMMUNITY)
Admission: RE | Admit: 2012-11-14 | Discharge: 2012-11-14 | Disposition: A | Discharge status: Home / Self Care | Payer: Medicare Other | Source: Ambulatory Visit

## 2012-11-16 ENCOUNTER — Encounter (HOSPITAL_COMMUNITY): Payer: Self-pay | Admitting: Pharmacy Technician

## 2012-11-21 ENCOUNTER — Ambulatory Visit (HOSPITAL_COMMUNITY)
Admission: RE | Admit: 2012-11-21 | Discharge: 2012-11-21 | Disposition: A | Discharge status: Home / Self Care | Payer: Medicare Other | Source: Ambulatory Visit | Attending: Anesthesiology | Admitting: Anesthesiology

## 2012-11-21 ENCOUNTER — Encounter (HOSPITAL_COMMUNITY)
Admission: RE | Admit: 2012-11-21 | Discharge: 2012-11-21 | Disposition: A | Discharge status: Home / Self Care | Payer: Medicare Other | Source: Ambulatory Visit | Attending: Surgery | Admitting: Surgery

## 2012-11-21 ENCOUNTER — Encounter (HOSPITAL_COMMUNITY): Payer: Self-pay

## 2012-11-21 VITALS — BP 133/80 | HR 59 | Temp 97.9°F | Resp 18 | Ht 65.5 in | Wt 176.2 lb

## 2012-11-21 DIAGNOSIS — I498 Other specified cardiac arrhythmias: Secondary | ICD-10-CM | POA: Insufficient documentation

## 2012-11-21 DIAGNOSIS — Z01810 Encounter for preprocedural cardiovascular examination: Secondary | ICD-10-CM | POA: Insufficient documentation

## 2012-11-21 DIAGNOSIS — I10 Essential (primary) hypertension: Secondary | ICD-10-CM | POA: Insufficient documentation

## 2012-11-21 DIAGNOSIS — D249 Benign neoplasm of unspecified breast: Secondary | ICD-10-CM | POA: Insufficient documentation

## 2012-11-21 DIAGNOSIS — Z01812 Encounter for preprocedural laboratory examination: Secondary | ICD-10-CM | POA: Insufficient documentation

## 2012-11-21 DIAGNOSIS — D241 Benign neoplasm of right breast: Secondary | ICD-10-CM

## 2012-11-21 HISTORY — DX: Cardiac arrhythmia, unspecified: I49.9

## 2012-11-21 HISTORY — DX: Pneumonia, unspecified organism: J18.9

## 2012-11-21 LAB — CBC
Hemoglobin: 13 g/dL (ref 12.0–15.0)
MCH: 30.3 pg (ref 26.0–34.0)
MCHC: 33.2 g/dL (ref 30.0–36.0)
Platelets: 203 10*3/uL (ref 150–400)
RDW: 14 % (ref 11.5–15.5)

## 2012-11-21 LAB — BASIC METABOLIC PANEL
Calcium: 9 mg/dL (ref 8.4–10.5)
GFR calc Af Amer: 60 mL/min — ABNORMAL LOW (ref >90)
GFR calc non Af Amer: 52 mL/min — ABNORMAL LOW (ref >90)
Glucose, Bld: 85 mg/dL (ref 70–99)
Potassium: 3.9 mEq/L (ref 3.5–5.1)
Sodium: 139 mEq/L (ref 135–145)

## 2012-11-21 MED ORDER — CEFAZOLIN SODIUM-DEXTROSE 2-3 GM-% IV SOLR
2.0000 g | INTRAVENOUS | Status: AC
  Administered 2012-11-22: 2 g via INTRAVENOUS
  Filled 2012-11-21: qty 50

## 2012-11-21 NOTE — H&P (Signed)
Patient ID: Regina [name], female DOB: [birth year], 72 y.o. MRN: [id number]  Chief Complaint   Patient presents with   .  Other     right breast papilloma   HPI  Regina [name] is a 72 y.o. female.  HPI  This is a pleasant female referred by Dr. Yolanda Bonine for abnormal microcalcifications and a papilloma in the right breast found on recent mammography and biopsy. She's had previous benign biopsies and lumpectomies on the right breast in the past. She is currently without complaints. She denies nipple discharge. She tolerated the biopsies well. She is doing well from a cardiopulmonary standpoint.  Past Medical History   Diagnosis  Date   .  IC (interstitial cystitis)    .  Fibroid    .  Apnea, sleep    .  PAD (peripheral artery disease)    .  Hypertension    .  Elevated cholesterol    .  Arthritis     Past Surgical History   Procedure  Laterality  Date   .  Abdominal hysterectomy       TAH LSO   .  Appendectomy     .  Cardiac electrophysiology study and ablation     .  Breast surgery       Lumpectomy-Benign   .  Breast surgery       Biopsy   .  Oophorectomy       LSO   .  Tonsillectomy     .  Hemorrhoid surgery      Family History   Problem  Relation  Age of Onset   .  Diabetes  Mother    .  Hypertension  Mother    .  Stroke  Mother    .  Cancer  Father      Multiple Myloma   .  Cancer  Daughter  15     OVARIAN CANCER STAGE 3   Social History  History   Substance Use Topics   .  Smoking status:  Never Smoker   .  Smokeless tobacco:  Never Used   .  Alcohol Use:  No    Allergies   Allergen  Reactions   .  Other      MYCIN drugs    Current Outpatient Prescriptions   Medication  Sig  Dispense  Refill   .  AmLODIPine Besylate (NORVASC PO)  Take by mouth.     Marland Kitchen  aspirin 81 MG tablet  Take 81 mg by mouth daily.     Marland Kitchen  BIOTIN FORTE PO  Take by mouth.     .  calcium citrate (CALCITRATE - DOSED IN MG ELEMENTAL CALCIUM) 950 MG tablet  Take 1 tablet by mouth daily.      .  fish oil-omega-3 fatty acids 1000 MG capsule  Take 1 g by mouth daily.     .  Metoprolol Tartrate (LOPRESSOR PO)  Take by mouth.     .  Multiple Vitamin (MULTIVITAMIN) tablet  Take 1 tablet by mouth daily.     .  Rosuvastatin Calcium (CRESTOR PO)  Take by mouth.     .  vitamin E 1000 UNIT capsule  Take 1,000 Units by mouth daily.     .  [DISCONTINUED] ATENOLOL PO  Take by mouth.     .  [DISCONTINUED] Calcium Carbonate-Vitamin D (CALCIUM + D PO)  Take by mouth.      No current facility-administered medications for this visit.  Review of Systems  Review of Systems  Constitutional: Negative for fever, chills and unexpected weight change.  HENT: Negative for hearing loss, congestion, sore throat, trouble swallowing and voice change.  Eyes: Negative for visual disturbance.  Respiratory: Negative for cough and wheezing.  Cardiovascular: Negative for chest pain, palpitations and leg swelling.  Gastrointestinal: Negative for nausea, vomiting, abdominal pain, diarrhea, constipation, blood in stool, abdominal distention and anal bleeding.  Genitourinary: Negative for hematuria, vaginal bleeding and difficulty urinating.  Musculoskeletal: Negative for arthralgias.  Skin: Negative for rash and wound.  Neurological: Negative for seizures, syncope and headaches.  Hematological: Negative for adenopathy. Does not bruise/bleed easily.  Psychiatric/Behavioral: Negative for confusion.  Blood pressure 127/77, pulse 66, temperature 97.7 F (36.5 C), temperature source Temporal, resp. rate 14, height 5\' 5"  (1.651 m), weight 181 lb (82.101 kg).  Physical Exam  Physical Exam  Constitutional: She is oriented to person, place, and time. She appears well-developed and well-nourished.  HENT:  Head: Normocephalic and atraumatic.  Right Ear: External ear normal.  Left Ear: External ear normal.  Nose: Nose normal.  Mouth/Throat: Oropharynx is clear and moist. No oropharyngeal exudate.  Eyes: Conjunctivae  are normal. Pupils are equal, round, and reactive to light. Right eye exhibits no discharge. Left eye exhibits no discharge. No scleral icterus.  Neck: Normal range of motion. Neck supple. No tracheal deviation present. No thyromegaly present.  Cardiovascular: Normal rate, regular rhythm, normal heart sounds and intact distal pulses.  No murmur heard.  Pulmonary/Chest: Effort normal and breath sounds normal. No respiratory distress. She has no wheezes.  Musculoskeletal: Normal range of motion. She exhibits no edema and no tenderness.  Lymphadenopathy:  She has no cervical adenopathy.  She has no axillary adenopathy.  Neurological: She is alert and oriented to person, place, and time.  Skin: Skin is warm and dry. No rash noted. No erythema.  Psychiatric: Her behavior is normal. Judgment normal.  Breasts: There is a well-healed scar at 3:00 position of the right breast. A stereotactic biopsy site is at the 12:00 position. There are no palpable masses. Arreola is normal  Data Reviewed  I have reviewed the mammograms showing the abnormal microcalcifications in the right breast. I have reviewed the pathology confirming an intraductal papilloma  Assessment  Intraductal papilloma of the right breast  Plan  Needle localized lumpectomy is recommended to rule out malignancy. I have discussed this with the patient in detail. I have discussed the surgical procedure. I discussed the risks which includes but is not limited to bleeding, infection, need for further surgery, et Karie Soda. She understands and wishes to proceed. Surgery will be scheduled

## 2012-11-21 NOTE — Progress Notes (Signed)
Ablation 04. Note in epic.  Office note , any tests req from dr Sharyn Lull from last visit in sept 14

## 2012-11-21 NOTE — Progress Notes (Signed)
Anesthesia Chart Review:  Patient is a 72 year old female scheduled for needle localized right breast lumpectomy on 11/22/12 by Dr. Magnus Ivan.  Her PAT appointment was on 11/21/12.  History includes non-smoker, HTN, hypercholesterolemia, PAD, OSA, SVT s/p ablation 08/2002 (Dr. Lewayne Bunting), fibroids with history of hysterectomy, arthritis, interstitial cystitis, right breast lumpectomy for benign disease.  PCP is Dr. Laurann Montana. Cardiologist is Dr. Sharyn Lull, with reported last office visit 10/2012.  Records requested, but are still pending.  EKG on 11/21/12 showed SB at 47 bpm.  (HR was 59 bpm with vitals.)  Nuclear stress test on 09/07/06 showed: 1. No evidence of inducible ischemia.  2. Normal left ventricular wall motion and thickening.  3. Normal ejection fraction of 73%.  Preoperative CXR and labs noted.   She has bradycardia on EKG but otherwise no ST/T wave abnormalities.  She has a history of SVT ablation, but no reported CAD/MI.  Based on currently available information, I would anticipate that she could proceed as planned.  I will review additional cardiac records, if received before tomorrow.  Velna Ochs [facility]/Anesthesiology Phone [phone] 11/21/2012 12:43 PM

## 2012-11-21 NOTE — Pre-Procedure Instructions (Addendum)
Tabatha Razzano Hartshorne  11/21/2012   Your procedure is scheduled on:  11/22/12  Report to [facility]  2 * 3 at when finish at breast center AM.  Call this number if you have problems the morning of surgery: [phone]   Remember:   Do not eat food or drink liquids after midnight.   Take these medicines the morning of surgery with A SIP OF WATER: amlodipine, metoprolol            Do not take any over the counter drugs today   Do not wear jewelry, make-up or nail polish.  Do not wear lotions, powders, or perfumes. You may wear deodorant.  Do not shave 48 hours prior to surgery. Men may shave face and neck.  Do not bring valuables to the hospital.  [facility] is not responsible                  for any belongings or valuables.               Contacts, dentures or bridgework may not be worn into surgery.  Leave suitcase in the car. After surgery it may be brought to your room.  For patients admitted to the hospital, discharge time is determined by your                treatment team.               Patients discharged the day of surgery will not be allowed to drive  home.  Name and phone number of your driver:   Special Instructions: Shower using CHG 2 nights before surgery and the night before surgery.  If you shower the day of surgery use CHG.  Use special wash - you have one bottle of CHG for all showers.  You should use approximately 1/3 of the bottle for each shower.   Please read over the following fact sheets that you were given: Pain Booklet, Coughing and Deep Breathing and Surgical Site Infection Prevention

## 2012-11-22 ENCOUNTER — Encounter (HOSPITAL_COMMUNITY)
Admission: RE | Disposition: A | Discharge status: Home / Self Care | Payer: Self-pay | Source: Ambulatory Visit | Attending: Surgery

## 2012-11-22 ENCOUNTER — Encounter (HOSPITAL_COMMUNITY): Payer: Self-pay | Admitting: *Deleted

## 2012-11-22 ENCOUNTER — Ambulatory Visit (HOSPITAL_COMMUNITY): Payer: Medicare Other | Admitting: Anesthesiology

## 2012-11-22 ENCOUNTER — Encounter (HOSPITAL_COMMUNITY): Payer: Medicare Other | Admitting: Vascular Surgery

## 2012-11-22 ENCOUNTER — Ambulatory Visit (HOSPITAL_COMMUNITY)
Admission: RE | Admit: 2012-11-22 | Discharge: 2012-11-22 | Disposition: A | Discharge status: Home / Self Care | Payer: Medicare Other | Source: Ambulatory Visit | Attending: Surgery | Admitting: Surgery

## 2012-11-22 DIAGNOSIS — D241 Benign neoplasm of right breast: Secondary | ICD-10-CM

## 2012-11-22 DIAGNOSIS — E7800 Pure hypercholesterolemia, unspecified: Secondary | ICD-10-CM | POA: Insufficient documentation

## 2012-11-22 DIAGNOSIS — D249 Benign neoplasm of unspecified breast: Secondary | ICD-10-CM

## 2012-11-22 DIAGNOSIS — N6089 Other benign mammary dysplasias of unspecified breast: Secondary | ICD-10-CM

## 2012-11-22 DIAGNOSIS — Z01818 Encounter for other preprocedural examination: Secondary | ICD-10-CM | POA: Insufficient documentation

## 2012-11-22 DIAGNOSIS — Z7982 Long term (current) use of aspirin: Secondary | ICD-10-CM | POA: Insufficient documentation

## 2012-11-22 DIAGNOSIS — M129 Arthropathy, unspecified: Secondary | ICD-10-CM | POA: Insufficient documentation

## 2012-11-22 DIAGNOSIS — Z79899 Other long term (current) drug therapy: Secondary | ICD-10-CM | POA: Insufficient documentation

## 2012-11-22 DIAGNOSIS — G4730 Sleep apnea, unspecified: Secondary | ICD-10-CM | POA: Insufficient documentation

## 2012-11-22 DIAGNOSIS — Z01810 Encounter for preprocedural cardiovascular examination: Secondary | ICD-10-CM | POA: Insufficient documentation

## 2012-11-22 DIAGNOSIS — Z01812 Encounter for preprocedural laboratory examination: Secondary | ICD-10-CM | POA: Insufficient documentation

## 2012-11-22 DIAGNOSIS — I10 Essential (primary) hypertension: Secondary | ICD-10-CM | POA: Insufficient documentation

## 2012-11-22 DIAGNOSIS — I739 Peripheral vascular disease, unspecified: Secondary | ICD-10-CM | POA: Insufficient documentation

## 2012-11-22 HISTORY — PX: BREAST LUMPECTOMY WITH NEEDLE LOCALIZATION: SHX5759

## 2012-11-22 SURGERY — BREAST LUMPECTOMY WITH NEEDLE LOCALIZATION
Anesthesia: General | Site: Breast | Laterality: Right | Wound class: Clean

## 2012-11-22 MED ORDER — ONDANSETRON HCL 4 MG/2ML IJ SOLN
INTRAMUSCULAR | Status: DC | PRN
  Administered 2012-11-22: 4 mg via INTRAMUSCULAR

## 2012-11-22 MED ORDER — BUPIVACAINE-EPINEPHRINE 0.25% -1:200000 IJ SOLN
INTRAMUSCULAR | Status: DC | PRN
  Administered 2012-11-22: 20 mL

## 2012-11-22 MED ORDER — BUPIVACAINE-EPINEPHRINE PF 0.25-1:200000 % IJ SOLN
INTRAMUSCULAR | Status: AC
  Filled 2012-11-22: qty 30

## 2012-11-22 MED ORDER — ONDANSETRON HCL 4 MG/2ML IJ SOLN: 4.0000 mg | Freq: Once | INTRAMUSCULAR | Status: DC | PRN

## 2012-11-22 MED ORDER — LACTATED RINGERS IV SOLN
INTRAVENOUS | Status: DC
  Administered 2012-11-22: 09:00:00 via INTRAVENOUS

## 2012-11-22 MED ORDER — SUFENTANIL CITRATE 50 MCG/ML IV SOLN
INTRAVENOUS | Status: DC | PRN
  Administered 2012-11-22: 10 ug via INTRAVENOUS

## 2012-11-22 MED ORDER — LIDOCAINE HCL (CARDIAC) 20 MG/ML IV SOLN
INTRAVENOUS | Status: DC | PRN
  Administered 2012-11-22: 40 mg via INTRAVENOUS

## 2012-11-22 MED ORDER — 0.9 % SODIUM CHLORIDE (POUR BTL) OPTIME
TOPICAL | Status: DC | PRN
  Administered 2012-11-22: 1000 mL

## 2012-11-22 MED ORDER — HYDROMORPHONE HCL PF 1 MG/ML IJ SOLN: 0.2500 mg | INTRAMUSCULAR | Status: DC | PRN

## 2012-11-22 MED ORDER — PROPOFOL 10 MG/ML IV BOLUS
INTRAVENOUS | Status: DC | PRN
  Administered 2012-11-22: 150 mg via INTRAVENOUS

## 2012-11-22 MED ORDER — EPHEDRINE SULFATE 50 MG/ML IJ SOLN
INTRAMUSCULAR | Status: DC | PRN
  Administered 2012-11-22: 10 mg via INTRAVENOUS

## 2012-11-22 MED ORDER — MIDAZOLAM HCL 5 MG/5ML IJ SOLN
INTRAMUSCULAR | Status: DC | PRN
  Administered 2012-11-22: 2 mg via INTRAVENOUS

## 2012-11-22 MED ORDER — HYDROCODONE-ACETAMINOPHEN 5-325 MG PO TABS: 1.0000 | ORAL_TABLET | ORAL | Status: DC | PRN

## 2012-11-22 SURGICAL SUPPLY — 46 items
APL SKNCLS STERI-STRIP NONHPOA (GAUZE/BANDAGES/DRESSINGS) ×1
BENZOIN TINCTURE PRP APPL 2/3 (GAUZE/BANDAGES/DRESSINGS) ×2 IMPLANT
BLADE SURG 10 STRL SS (BLADE) ×2 IMPLANT
BLADE SURG 15 STRL LF DISP TIS (BLADE) ×1 IMPLANT
BLADE SURG 15 STRL SS (BLADE) ×2
CANISTER SUCTION 2500CC (MISCELLANEOUS) ×1 IMPLANT
CHLORAPREP W/TINT 26ML (MISCELLANEOUS) ×2 IMPLANT
COVER SURGICAL LIGHT HANDLE (MISCELLANEOUS) ×2 IMPLANT
DEVICE DUBIN SPECIMEN MAMMOGRA (MISCELLANEOUS) ×2 IMPLANT
DRAPE CHEST BREAST 15X10 FENES (DRAPES) ×2 IMPLANT
DRSG TEGADERM 4X4.75 (GAUZE/BANDAGES/DRESSINGS) ×2 IMPLANT
ELECT CAUTERY BLADE 6.4 (BLADE) ×2 IMPLANT
ELECT REM PT RETURN 9FT ADLT (ELECTROSURGICAL) ×2
ELECTRODE REM PT RTRN 9FT ADLT (ELECTROSURGICAL) ×1 IMPLANT
GLOVE BIO SURGEON STRL SZ7.5 (GLOVE) ×2 IMPLANT
GLOVE BIOGEL PI IND STRL 7.0 (GLOVE) IMPLANT
GLOVE BIOGEL PI IND STRL 7.5 (GLOVE) IMPLANT
GLOVE BIOGEL PI INDICATOR 7.0 (GLOVE) ×1
GLOVE BIOGEL PI INDICATOR 7.5 (GLOVE) ×1
GLOVE SURG SIGNA 7.5 PF LTX (GLOVE) ×2 IMPLANT
GLOVE SURG SS PI 7.0 STRL IVOR (GLOVE) ×1 IMPLANT
GOWN STRL NON-REIN LRG LVL3 (GOWN DISPOSABLE) ×3 IMPLANT
GOWN STRL REIN XL XLG (GOWN DISPOSABLE) ×2 IMPLANT
KIT BASIN OR (CUSTOM PROCEDURE TRAY) ×2 IMPLANT
KIT MARKER MARGIN INK (KITS) IMPLANT
KIT ROOM TURNOVER OR (KITS) ×2 IMPLANT
NDL HYPO 25GX1X1/2 BEV (NEEDLE) ×1 IMPLANT
NEEDLE HYPO 25GX1X1/2 BEV (NEEDLE) ×2 IMPLANT
NS IRRIG 1000ML POUR BTL (IV SOLUTION) ×2 IMPLANT
PACK SURGICAL SETUP 50X90 (CUSTOM PROCEDURE TRAY) ×2 IMPLANT
PAD ARMBOARD 7.5X6 YLW CONV (MISCELLANEOUS) ×2 IMPLANT
PENCIL BUTTON HOLSTER BLD 10FT (ELECTRODE) ×2 IMPLANT
SPONGE GAUZE 4X4 12PLY (GAUZE/BANDAGES/DRESSINGS) ×2 IMPLANT
SPONGE LAP 4X18 X RAY DECT (DISPOSABLE) ×2 IMPLANT
STRIP CLOSURE SKIN 1/2X4 (GAUZE/BANDAGES/DRESSINGS) ×2 IMPLANT
SUT MON AB 4-0 PC3 18 (SUTURE) ×2 IMPLANT
SUT SILK 2 0 SH (SUTURE) IMPLANT
SUT VIC AB 3-0 SH 27 (SUTURE) ×2
SUT VIC AB 3-0 SH 27XBRD (SUTURE) ×1 IMPLANT
SYR BULB 3OZ (MISCELLANEOUS) ×2 IMPLANT
SYR CONTROL 10ML LL (SYRINGE) ×2 IMPLANT
TOWEL OR 17X24 6PK STRL BLUE (TOWEL DISPOSABLE) ×2 IMPLANT
TOWEL OR 17X26 10 PK STRL BLUE (TOWEL DISPOSABLE) ×2 IMPLANT
TOWEL OR NON WOVEN STRL DISP B (DISPOSABLE) ×1 IMPLANT
TUBE CONNECTING 12X1/4 (SUCTIONS) ×1 IMPLANT
YANKAUER SUCT BULB TIP NO VENT (SUCTIONS) ×1 IMPLANT

## 2012-11-22 NOTE — Op Note (Signed)
NAMEPROMISS, LABARBERA             ACCOUNT NO.:  [id number]  MEDICAL RECORD NO.:  [id number]  LOCATION:  [facility]                         FACILITY:  [facility]  PHYSICIAN:  Abigail Miyamoto, M.D. DATE OF BIRTH:  Nov 28, 1940  DATE OF PROCEDURE:  11/22/2012 DATE OF DISCHARGE:  11/22/2012                              OPERATIVE REPORT   PREOPERATIVE DIAGNOSIS:  Right breast papilloma.  POSTOPERATIVE DIAGNOSIS:  Right breast papilloma.  PROCEDURE:  Needle localized right breast lumpectomy.  Otting:  Abigail Miyamoto, M.D.  ANESTHESIA:  General and 0.5% Marcaine.  ESTIMATED BLOOD LOSS:  Minimal.  INDICATIONS:  This is a 72 year old female, who was found to have an abnormality on a mammography of her breast.  Stereotactic biopsy was performed and showed this to be a papilloma.  Decision was made to proceed with removal of this area under needle localization.  FINDINGS:  The lumpectomy specimen was x-rayed and the suspicious area was confirmed to be in the lumpectomy specimen.  PROCEDURE IN DETAIL:  The patient was brought to the operating room, identified as Chemical engineer.  he was placed supine on the operating table.  General anesthesia was induced.  Her right breast was prepped and draped in usual sterile fashion.  I anesthetized the skin at the localization wire.  I then made an incision with a scalpel.  I took this down to the breast tissue with electrocautery.  I then followed the wire down and performed a lumpectomy removing all the tissue around localization wire going down to the chest wall.  Once the specimen was removed, it was x-rayed in the room and the suspicious area was found to be in the lumpectomy specimen with a marker clip.  Radiology confirmed this to be so.  At this point, the wound was thoroughly irrigated with saline.  Hemostasis was achieved with cautery.  The subcutaneous tissue was closed with 3-0 Vicryl sutures and the skin was closed with running 4-0  Monocryl.  Steri-Strips, gauze, and Tegaderm were then applied.  The patient tolerated the procedure well.  All the counts were correct at the end of the procedure.  The patient was then extubated in the operating room and taken in a stable condition to recovery room.     Abigail Miyamoto, M.D.     DB/MEDQ  D:  11/22/2012  T:  11/22/2012  Job:  [id number]

## 2012-11-22 NOTE — Interval H&P Note (Signed)
History and Physical Interval Note: no change in H and P  11/22/2012 9:20 AM  Regina [name]  has presented today for surgery, with the diagnosis of right breast papilloma  The various methods of treatment have been discussed with the patient and family. After consideration of risks, benefits and other options for treatment, the patient has consented to  Procedure(s) with comments: BREAST LUMPECTOMY WITH NEEDLE LOCALIZATION (Right) - needle localization 7:30 at [facility]  as a surgical intervention .  The patient's history has been reviewed, patient examined, no change in status, stable for surgery.  I have reviewed the patient's chart and labs.  Questions were answered to the patient's satisfaction.     Quinten Allerton A

## 2012-11-22 NOTE — Preoperative (Signed)
Beta Blockers   Reason not to administer Beta Blockers:Metoprolol given at 0630 hrs on 11/22/2012

## 2012-11-22 NOTE — Anesthesia Preprocedure Evaluation (Signed)
Anesthesia Evaluation  Patient identified by MRN, date of birth, ID band Patient awake    Reviewed: Allergy & Precautions, H&P , NPO status , Patient's Chart, lab work & pertinent test results  Airway Mallampati: II      Dental  (+) Dental Advisory Given and Teeth Intact   Pulmonary  breath sounds clear to auscultation        Cardiovascular Rhythm:Regular Rate:Normal     Neuro/Psych    GI/Hepatic   Endo/Other    Renal/GU      Musculoskeletal   Abdominal   Peds  Hematology   Anesthesia Other Findings   Reproductive/Obstetrics                           Anesthesia Physical Anesthesia Plan  ASA: III  Anesthesia Plan: General   Post-op Pain Management:    Induction: Intravenous  Airway Management Planned: LMA  Additional Equipment:   Intra-op Plan:   Post-operative Plan:   Informed Consent: I have reviewed the patients History and Physical, chart, labs and discussed the procedure including the risks, benefits and alternatives for the proposed anesthesia with the patient or authorized representative who has indicated his/her understanding and acceptance.   Dental advisory given  Plan Discussed with: CRNA and Anesthesiologist  Anesthesia Plan Comments: (R. Breast papilloma Htn H/O SVT S/P ablation 2004  Plan GA with LMA  Kipp Brood, MD)        Anesthesia Quick Evaluation

## 2012-11-22 NOTE — Transfer of Care (Signed)
Immediate Anesthesia Transfer of Care Note  Patient: Regina [name]  Procedure(s) Performed: Procedure(s): BREAST LUMPECTOMY WITH NEEDLE LOCALIZATION (Right)  Patient Location: PACU  Anesthesia Type:General  Level of Consciousness: awake and oriented  Airway & Oxygen Therapy: Patient Spontanous Breathing and Patient connected to nasal cannula oxygen  Post-op Assessment: Report given to PACU RN, Post -op Vital signs reviewed and stable and Patient moving all extremities  Post vital signs: Reviewed and stable  Complications: No apparent anesthesia complications

## 2012-11-22 NOTE — Anesthesia Postprocedure Evaluation (Signed)
  Anesthesia Post-op Note  Patient: Regina [name]  Procedure(s) Performed: Procedure(s): BREAST LUMPECTOMY WITH NEEDLE LOCALIZATION (Right)  Patient Location: PACU  Anesthesia Type:General  Level of Consciousness: awake, alert  and oriented  Airway and Oxygen Therapy: Patient Spontanous Breathing and Patient connected to nasal cannula oxygen  Post-op Pain: mild  Post-op Assessment: Post-op Vital signs reviewed, Patient's Cardiovascular Status Stable, Respiratory Function Stable, Patent Airway and Pain level controlled  Post-op Vital Signs: stable  Complications: No apparent anesthesia complications

## 2012-11-22 NOTE — Op Note (Signed)
BREAST LUMPECTOMY WITH NEEDLE LOCALIZATION  Procedure Note  Regina [name] 11/22/2012   Pre-op Diagnosis: right breast papilloma     Post-op Diagnosis: same  Procedure(s): RIGHT BREAST PARTIAL MASTECTOMY WITH NEEDLE LOCALIZATION  Blase(s): Shelly Rubenstein, MD  Anesthesia: General  Staff:  Circulator: Gerre Pebbles Sipsis, RN Scrub Person: Lina Sayre, RN; Janeece Agee Pingue, CST Circulator Assistant: Jani Files, RN  Estimated Blood Loss: Minimal               Specimens: sent to path           [employer] [facility] A   Date: 11/22/2012  Time: 10:18 AM

## 2012-11-22 NOTE — Progress Notes (Signed)
Report given to elise compton rn as caregiver

## 2012-11-22 NOTE — Anesthesia Procedure Notes (Signed)
Procedure Name: LMA Insertion Date/Time: 11/22/2012 9:44 AM Performed by: Charm Barges, Aslynn Brunetti R Pre-anesthesia Checklist: Patient identified, Emergency Drugs available, Suction available, Patient being monitored and Timeout performed Patient Re-evaluated:Patient Re-evaluated prior to inductionOxygen Delivery Method: Circle system utilized Preoxygenation: Pre-oxygenation with 100% oxygen Intubation Type: IV induction Ventilation: Mask ventilation without difficulty LMA: LMA inserted LMA Size: 4.0 Number of attempts: 1 Tube secured with: Tape Dental Injury: Teeth and Oropharynx as per pre-operative assessment

## 2012-11-24 ENCOUNTER — Encounter (HOSPITAL_COMMUNITY): Payer: Self-pay | Admitting: Surgery

## 2012-11-28 ENCOUNTER — Encounter (INDEPENDENT_AMBULATORY_CARE_PROVIDER_SITE_OTHER): Payer: Self-pay

## 2012-12-04 ENCOUNTER — Ambulatory Visit (INDEPENDENT_AMBULATORY_CARE_PROVIDER_SITE_OTHER): Payer: Medicare Other | Admitting: Surgery

## 2012-12-04 ENCOUNTER — Encounter (INDEPENDENT_AMBULATORY_CARE_PROVIDER_SITE_OTHER): Payer: Self-pay | Admitting: Surgery

## 2012-12-04 VITALS — BP 122/84 | HR 60 | Resp 16 | Ht 65.5 in | Wt 178.6 lb

## 2012-12-04 DIAGNOSIS — Z09 Encounter for follow-up examination after completed treatment for conditions other than malignant neoplasm: Secondary | ICD-10-CM

## 2012-12-04 NOTE — Progress Notes (Signed)
Subjective:     Patient ID: Regina [name], female   DOB: 05-11-1940, 72 y.o.   MRN: [id number]  HPI She is here for her first postoperative visit status post needle localized right breast lumpectomy. She has had some mild discomfort in the right breast but is otherwise doing well  Review of Systems     Objective:   Physical Exam On exam, her incision is well-healed. There is a mild seroma with no evidence of infection  The final pathology showed a papilloma as well as atypical ductal hyperplasia and a radial scar with no evidence of malignancy     Assessment:     Patient stable postop     Plan:     She may resume her normal activities. She will continue her self examinations and yearly mammograms. I will see her back as needed

## 2013-03-06 ENCOUNTER — Ambulatory Visit (INDEPENDENT_AMBULATORY_CARE_PROVIDER_SITE_OTHER): Payer: Medicare HMO | Admitting: Gynecology

## 2013-03-06 ENCOUNTER — Encounter: Payer: Self-pay | Admitting: Gynecology

## 2013-03-06 VITALS — BP 134/82

## 2013-03-06 DIAGNOSIS — Z79890 Hormone replacement therapy (postmenopausal): Secondary | ICD-10-CM

## 2013-03-06 DIAGNOSIS — Z113 Encounter for screening for infections with a predominantly sexual mode of transmission: Secondary | ICD-10-CM

## 2013-03-06 DIAGNOSIS — N898 Other specified noninflammatory disorders of vagina: Secondary | ICD-10-CM

## 2013-03-06 DIAGNOSIS — N952 Postmenopausal atrophic vaginitis: Secondary | ICD-10-CM | POA: Insufficient documentation

## 2013-03-06 DIAGNOSIS — N899 Noninflammatory disorder of vagina, unspecified: Secondary | ICD-10-CM

## 2013-03-06 LAB — URINALYSIS W MICROSCOPIC + REFLEX CULTURE
Bilirubin Urine: NEGATIVE
Glucose, UA: NEGATIVE mg/dL
Hgb urine dipstick: NEGATIVE
Ketones, ur: NEGATIVE mg/dL
LEUKOCYTES UA: NEGATIVE
Nitrite: NEGATIVE
PH: 5.5 (ref 5.0–8.0)
PROTEIN: NEGATIVE mg/dL
Specific Gravity, Urine: <1.005 — ABNORMAL LOW (ref 1.005–1.030)
UROBILINOGEN UA: 0.2 mg/dL (ref 0.0–1.0)

## 2013-03-06 LAB — WET PREP FOR TRICH, YEAST, CLUE
Clue Cells Wet Prep HPF POC: NONE SEEN
Trich, Wet Prep: NONE SEEN
WBC WET PREP: NONE SEEN
Yeast Wet Prep HPF POC: NONE SEEN

## 2013-03-06 MED ORDER — NONFORMULARY OR COMPOUNDED ITEM: Status: DC

## 2013-03-06 NOTE — Patient Instructions (Signed)

## 2013-03-06 NOTE — Progress Notes (Signed)
   Patient is a 73-year-old who presented to the office today complaining vaginal burning like sensation for the past few days like her "bottom feels sore". Patient with past history of total abdominal hysterectomy along with left salpingo-oophorectomy for fibroid uterus in 1982. Patient has not been on hormone replacement therapy for many years. She has informed me that she recently became sexually active for the first time in a very long time. She denied use condoms with her new partner. She denied any dysuria or frequency. She denied any discharge per se.  Exam: Bartholin urethra Skene glands within atrophic changes Vagina: Atrophic changes noted vaginal cuff intact no lesion seen Bimanual exam: Not done Rectal exam: Not done  Urinalysis negative GC and Chlamydia culture obtained results pending at time of this dictation Wet prep negative  Assessment/plan: Vaginal atrophy postmenopausal patient recent sexual activity for first time in many years. We discussed starting her on vaginal estrogen for her to apply every night for one week and then twice a week thereafter. She will be prescribed estradiol 0.02% to apply each bedtime for one week then twice a week thereafter. She is otherwise scheduled to return back in April for her annual exam. The risks benefits and pros and cons of estrogen therapy were discussed as well as literature information provided. GC and chlamydia culture pending at time of this dictation.

## 2013-03-07 LAB — GC/CHLAMYDIA PROBE AMP
CT Probe RNA: NEGATIVE
GC Probe RNA: NEGATIVE

## 2013-05-09 ENCOUNTER — Encounter: Payer: Self-pay | Admitting: Gynecology

## 2013-05-22 ENCOUNTER — Encounter (INDEPENDENT_AMBULATORY_CARE_PROVIDER_SITE_OTHER): Payer: Self-pay

## 2013-05-23 ENCOUNTER — Ambulatory Visit (INDEPENDENT_AMBULATORY_CARE_PROVIDER_SITE_OTHER): Payer: Medicare HMO | Admitting: Gynecology

## 2013-05-23 ENCOUNTER — Encounter: Payer: Self-pay | Admitting: Gynecology

## 2013-05-23 VITALS — BP 146/90 | Ht 65.0 in | Wt 172.0 lb

## 2013-05-23 DIAGNOSIS — Z8041 Family history of malignant neoplasm of ovary: Secondary | ICD-10-CM

## 2013-05-23 DIAGNOSIS — N951 Menopausal and female climacteric states: Secondary | ICD-10-CM

## 2013-05-23 DIAGNOSIS — N318 Other neuromuscular dysfunction of bladder: Secondary | ICD-10-CM

## 2013-05-23 DIAGNOSIS — N952 Postmenopausal atrophic vaginitis: Secondary | ICD-10-CM

## 2013-05-23 DIAGNOSIS — M949 Disorder of cartilage, unspecified: Secondary | ICD-10-CM

## 2013-05-23 DIAGNOSIS — M899 Disorder of bone, unspecified: Secondary | ICD-10-CM

## 2013-05-23 DIAGNOSIS — M8580 Other specified disorders of bone density and structure, unspecified site: Secondary | ICD-10-CM

## 2013-05-23 DIAGNOSIS — N3010 Interstitial cystitis (chronic) without hematuria: Secondary | ICD-10-CM

## 2013-05-23 DIAGNOSIS — N3281 Overactive bladder: Secondary | ICD-10-CM | POA: Insufficient documentation

## 2013-05-23 NOTE — Progress Notes (Addendum)
Regina [name] April 25, 1940 [id number]   History:    73 y.o.  for GYN exam and followup.Review of patient's records indicated she has history of osteopenia and her last bone density study was in 2014 whereby her lowest  T score was at the right femoral neck with a value -1.8 and study was compared with previous study a few years prior there was reportedly 12% increase in bone mass at the AP spine and no significant change in either hip. Patient has had 2 previous right breast biopsies which were benign the last one in December 2014 I. Dr. Rush Farmer.Review of her record indicated that she had a total abdominal hysterectomy with left salpingo-oophorectomy in 1982 secondary to uterine fibroids. Patient is on no hormone replacement therapy.  Patient denies any prior history of dysplasia. Last Pap smear reported being 2012. Patient states that her Pneumovax vaccine is up-to-date.  The patient stated that her daughter at the age of 6 had been diagnosed with ovarian cancer stage III. She did check and she did test negative for the BRCA1 and BRCA2 gene mutation.Patient's primary physician is Dr. Harlan Stains who has been treating her for hyperlipidemia and hypertension. She has a history of interstitial cystitis but since Dr. Elyse Jarvis instilled DMSO she has been fine. She has nocturia x3. She has no urgency. She has no dysuria. She is having no pelvic pain.     A corrections to be made along the previous dictation of April 2014 where it and then vertically have been dictated the presence of the cervix and uterus which was a type error since patient has had a previous abdominal hysterectomy and left salpingo-oophorectomy.  Past medical history,surgical history, family history and social history were all reviewed and documented in the EPIC chart.  Gynecologic History No LMP recorded. Patient has had a hysterectomy. Contraception: status post hysterectomy Last Pap: 2012. Results were: normal Last  mammogram: 2015. Results were: normal  Obstetric History OB History  Gravida Para Term Preterm AB SAB TAB Ectopic Multiple Living  2 2 2       2     # Outcome Date GA Lbr Len/2nd Weight Sex Delivery Anes PTL Lv  2 TRM           1 TRM                ROS: A ROS was performed and pertinent positives and negatives are included in the history.  GENERAL: No fevers or chills. HEENT: No change in vision, no earache, sore throat or sinus congestion. NECK: No pain or stiffness. CARDIOVASCULAR: No chest pain or pressure. No palpitations. PULMONARY: No shortness of breath, cough or wheeze. GASTROINTESTINAL: No abdominal pain, nausea, vomiting or diarrhea, melena or bright red blood per rectum. GENITOURINARY: No urinary frequency, urgency, hesitancy or dysuria. MUSCULOSKELETAL: No joint or muscle pain, no back pain, no recent trauma. DERMATOLOGIC: No rash, no itching, no lesions. ENDOCRINE: No polyuria, polydipsia, no heat or cold intolerance. No recent change in weight. HEMATOLOGICAL: No anemia or easy bruising or bleeding. NEUROLOGIC: No headache, seizures, numbness, tingling or weakness. PSYCHIATRIC: No depression, no loss of interest in normal activity or change in sleep pattern.     Exam: chaperone present  BP 146/90  Ht 5' 5"  (1.651 m)  Wt 172 lb (78.019 kg)  BMI 28.62 kg/m2  Body mass index is 28.62 kg/(m^2).  General appearance : Well developed well nourished female. No acute distress HEENT: Neck supple, trachea midline, no carotid bruits, no  thyroidmegaly Lungs: Clear to auscultation, no rhonchi or wheezes, or rib retractions  Heart: Regular rate and rhythm, no murmurs or gallops Breast:Examined in sitting and supine position were symmetrical in appearance, no palpable masses or tenderness,  no skin retraction, no nipple inversion, no nipple discharge, no skin discoloration, no axillary or supraclavicular lymphadenopathy Abdomen: no palpable masses or tenderness, no rebound or  guarding Extremities: no edema or skin discoloration or tenderness  Pelvic:  Bartholin, Urethra, Skene Glands: Within normal limits             Vagina: No gross lesions or discharge  Cervix: Absent  Uterus  absent  Adnexa  Without masses or tenderness  Anus and perineum  normal   Rectovaginal  normal sphincter tone without palpated masses or tenderness             Hemoccult PCP provides     Assessment/Plan:  73 y.o. female whose daughter was diagnosed at the age of 50 with ovarian cancer and BRCA1 and BRCA2 gene mutation negative. I'm going to recommend that we screen her once a year with an ultrasound and she will schedule one.. She is due for her next bone density study next year. She was reminded on the importance of calcium and vitamin D a regular exercise for osteoporosis prevention. Her PCP will be drawn her blood work. No Pap smear done today in accordance to the new guidelines  Note: This dictation was prepared with  Dragon/digital dictation along withSmart phrase technology. Any transcriptional errors that result from this process are unintentional.   Terrance Mass MD, 10:11 AM 05/23/2013

## 2013-05-23 NOTE — Patient Instructions (Addendum)
Tetanus, Diphtheria (Td) Vaccine What You Need to Know WHY GET VACCINATED? Tetanus  and diphtheria are very serious diseases. They are rare in the [place] today, but people who do become infected often have severe complications. Td vaccine is used to protect adolescents and adults from both of these diseases. Both tetanus and diphtheria are infections caused by bacteria. Diphtheria spreads from person to person through coughing or sneezing. Tetanus-causing bacteria enter the body through cuts, scratches, or wounds. TETANUS (Lockjaw) causes painful muscle tightening and stiffness, usually all over the body.  It can lead to tightening of muscles in the head and neck so you can't open your mouth, swallow, or sometimes even breathe. Tetanus kills about 1 out of every 5 people who are infected. DIPHTHERIA can cause a thick coating to form in the back of the throat.  It can lead to breathing problems, paralysis, heart failure, and death. Before vaccines, the [place] saw as many as 200,000 cases a year of diphtheria and hundreds of cases of tetanus. Since vaccination began, cases of both diseases have dropped by about 99%. TD VACCINE Td vaccine can protect adolescents and adults from tetanus and diphtheria. Td is usually given as a booster dose every 10 years but it can also be given earlier after a severe and dirty wound or burn. Your doctor can give you more information. Td may safely be given at the same time as other vaccines. SOME PEOPLE SHOULD NOT GET THIS VACCINE  If you ever had a life-threatening allergic reaction after a dose of any tetanus or diphtheria containing vaccine, OR if you have a severe allergy to any part of this vaccine, you should not get Td. Tell your doctor if you have any severe allergies.  Talk to your doctor if you:  have epilepsy or another nervous system problem,  had severe pain or swelling after any vaccine containing diphtheria or tetanus,  ever had  Guillain Barr Syndrome (GBS),  aren't feeling well on the day the shot is scheduled. RISKS OF A VACCINE REACTION With a vaccine, like any medicine, there is a chance of side effects. These are usually mild and go away on their own. Serious side effects are also possible, but are very rare. Most people who get Td vaccine do not have any problems with it. Mild Problems  following Td (Did not interfere with activities)  Pain where the shot was given (about 8 people in 10)  Redness or swelling where the shot was given (about 1 person in 3)  Mild fever (about 1 person in 15)  Headache or Tiredness (uncommon) Moderate Problems following Td (Interfered with activities, but did not require medical attention)  Fever over 102 F (38.9 C) (rare) Severe Problems  following Td (Unable to perform usual activities; required medical attention)  Swelling, severe pain, bleeding, or redness in the arm where the shot was given (rare). Problems that could happen after any vaccine:  Brief fainting spells can happen after any medical procedure, including vaccination. Sitting or lying down for about 15 minutes can help prevent fainting, and injuries caused by a fall. Tell your doctor if you feel dizzy, or have vision changes or ringing in the ears.  Severe shoulder pain and reduced range of motion in the arm where a shot was given can happen, very rarely, after a vaccination.  Severe allergic reactions from a vaccine are very rare, estimated at less than 1 in a million doses. If one were to occur, it would   usually be within a few minutes to a few hours after the vaccination. WHAT IF THERE IS A SERIOUS REACTION? What should I look for?  Look for anything that concerns you, such as signs of a severe allergic reaction, very high fever, or behavior changes. Signs of a severe allergic reaction can include hives, swelling of the face and throat, difficulty breathing, a fast heartbeat, dizziness, and  weakness. These would usually start a few minutes to a few hours after the vaccination. What should I do?  If you think it is a severe allergic reaction or other emergency that can't wait, call 911 or get the person to the nearest hospital. Otherwise, call your doctor.  Afterward, the reaction should be reported to the Vaccine Adverse Event Reporting System (VAERS). Your doctor might file this report, or, you can do it yourself through the VAERS website or by calling 1-800-822-7967. VAERS is only for reporting reactions. They do not give medical advice. THE NATIONAL VACCINE INJURY COMPENSATION PROGRAM The National Vaccine Injury Compensation Program (VICP) is a federal program that was created to compensate people who may have been injured by certain vaccines. Persons who believe they may have been injured by a vaccine can learn about the program and about filing a claim by calling 1-800-338-2382 or visiting the VICP website. HOW CAN I LEARN MORE?  Ask your doctor.  Contact your local or state health department.  Contact the Centers for Disease Control and Prevention (CDC):  Call 1-800-232-4636 (1-800-CDC-INFO)  Visit CDC's vaccines website CDC Td Vaccine Interim VIS (03/14/12) Document Released: 11/22/2005 Document Revised: 05/22/2012 Document Reviewed: 05/17/2012 [employer] Patient Information 2014 [employer]. Transvaginal Ultrasound Transvaginal ultrasound is a pelvic ultrasound, using a metal probe that is placed in the vagina, to look at a women's female organs. Transvaginal ultrasound is a method of seeing inside the pelvis of a woman. The ultrasound machine sends out sound waves from the transducer (probe). These sound waves bounce off body structures (like an echo) to create a picture. The picture shows up on a monitor. It is called transvaginal because the probe is inserted into the vagina. There should be very little discomfort from the vaginal probe. This test can also be used  during pregnancy. Endovaginal ultrasound is another name for a transvaginal ultrasound. In a transabdominal ultrasound, the probe is placed on the outside of the belly. This method gives pictures that are lower quality than pictures from the transvaginal technique. Transvaginal ultrasound is used to look for problems of the female genital tract. Some such problems include:  Infertility problems.  Congenital (birth defect) malformations of the uterus and ovaries.  Tumors in the uterus.  Abnormal bleeding.  Ovarian tumors and cysts.  Abscess (inflamed tissue around pus) in the pelvis.  Unexplained abdominal or pelvic pain.  Pelvic infection. DURING PREGNANCY, TRANSVAGINAL ULTRASOUND MAY BE USED TO LOOK AT:  Normal pregnancy.  Ectopic pregnancy (pregnancy outside the uterus).  Fetal heartbeat.  Abnormalities in the pelvis, that are not seen well with transabdominal ultrasound.  Suspected twins or multiples.  Impending miscarriage.  Problems with the cervix (incompetent cervix, not able to stay closed and hold the baby).  When doing an amniocentesis (removing fluid from the pregnancy sac, for testing).  Looking for abnormalities of the baby.  Checking the growth, development, and age of the fetus.  Measuring the amount of fluid in the amniotic sac.  When doing an external version of the baby (moving baby into correct position).  Evaluating the baby for   problems in high risk pregnancies (biophysical profile).  Suspected fetal demise (death). Sometimes a special ultrasound method called Saline Infusion Sonography (SIS) is used for a more accurate look at the uterus. Sterile saline (salt water) is injected into the uterus of non-pregnant patients to see the inside of the uterus better. SIS is not used on pregnant women. The vaginal probe can also assist in obtaining biopsies of abnormal areas, in draining fluid from cysts on the ovary, and in finding IUDs (intrauterine  device, birth control) that cannot be located. PREPARATION FOR TEST A transvaginal ultrasound is done with the bladder empty. The transabdominal ultrasound is done with your bladder full. You may be asked to drink several glasses of water before that exam. Sometimes, a transabdominal ultrasound is done just after a transvaginal ultrasound, to look at organs in your abdomen. PROCEDURE  You will lie down on a table, with your knees bent and your feet in foot holders. The probe is covered with a condom. A sterile lubricant is put into the vagina and on the probe. The lubricant helps transmit the sound waves and avoid irritating the vagina. Your caregiver will move the probe inside the vaginal cavity to scan the pelvic structures. A normal test will show a normal pelvis and normal contents. An abnormal test will show abnormalities of the pelvis, placenta, or baby. ABNORMAL RESULTS MAY BE DUE TO:  Growths or tumors in the:  Uterus.  Ovaries.  Vagina.  Other pelvic structures.  Non-cancerous growths of the uterus and ovaries.  Twisting of the ovary, cutting off blood supply to the ovary (ovarian torsion).  Areas of infection, including:  Pelvic inflammatory disease.  Abscess in the pelvis.  Locating an IUD. PROBLEMS FOUND IN PREGNANT WOMEN MAY INCLUDE:  Ectopic pregnancy (pregnancy outside the uterus).  Multiple pregnancies.  Early dilation (opening) of the cervix. This may indicate an incompetent cervix and early delivery.  Impending miscarriage.  Fetal death.  Problems with the placenta, including:  Placenta has grown over the opening of the womb (placenta previa).  Placenta has separated early in the womb (placental abruption).  Placenta grows into the muscle of the uterus (placenta accreta).  Tumors of pregnancy, including gestational trophoblastic disease. This is an abnormal pregnancy, with no fetus. The uterus is filled with many grape-like cysts that could sometimes  be cancerous.  Incorrect position of the fetus (breech, vertex).  Intrauterine fetal growth retardation (IUGR) (poor growth in the womb).  Fetal abnormalities or infection. RISKS AND COMPLICATIONS There are no known risks to the ultrasound procedure. There is no X-ray used when doing an ultrasound. Document Released: 01/07/2004 Document Revised: 04/19/2011 Document Reviewed: 12/25/2008 [facility] Patient Information 2014 [place], [place].

## 2013-05-30 ENCOUNTER — Ambulatory Visit (INDEPENDENT_AMBULATORY_CARE_PROVIDER_SITE_OTHER): Payer: Medicare HMO

## 2013-05-30 ENCOUNTER — Other Ambulatory Visit: Payer: Self-pay | Admitting: Gynecology

## 2013-05-30 ENCOUNTER — Ambulatory Visit (INDEPENDENT_AMBULATORY_CARE_PROVIDER_SITE_OTHER): Payer: Medicare HMO | Admitting: Gynecology

## 2013-05-30 VITALS — BP 128/82

## 2013-05-30 DIAGNOSIS — N83339 Acquired atrophy of ovary and fallopian tube, unspecified side: Secondary | ICD-10-CM

## 2013-05-30 DIAGNOSIS — Z8041 Family history of malignant neoplasm of ovary: Secondary | ICD-10-CM

## 2013-05-30 NOTE — Progress Notes (Signed)
   73 year old who was instructed to return back to the office today for an ultrasound. Patient's daughter at the age of 61 had been diagnosed with ovarian cancer stage III. 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 Invasive high-grade carcinoma involving proximal segment of fallopian tube. The right tube and ovary also with undifferentiated carcinoma with focal serous papillary differentiation involving the ovary as well. Metastatic pelvic lymph nodes were noted.  The patient's ultrasound today demonstrated absence of the uterus and left tube and ovary. Right ovary was atrophic normal echo pattern negative fluid in the cul-de-sac normal. Masses in either adnexa.  We will continue to do ultrasound yearly for close surveillance. She was reminded to contact her other daughter for annual screening as well.

## 2013-12-10 ENCOUNTER — Encounter: Payer: Self-pay | Admitting: Gynecology

## 2013-12-12 ENCOUNTER — Ambulatory Visit (INDEPENDENT_AMBULATORY_CARE_PROVIDER_SITE_OTHER): Payer: Medicare HMO

## 2013-12-12 ENCOUNTER — Ambulatory Visit (INDEPENDENT_AMBULATORY_CARE_PROVIDER_SITE_OTHER): Payer: Medicare HMO | Admitting: Podiatry

## 2013-12-12 VITALS — BP 122/76 | HR 63 | Resp 16 | Ht 65.0 in | Wt 166.0 lb

## 2013-12-12 DIAGNOSIS — M779 Enthesopathy, unspecified: Secondary | ICD-10-CM

## 2013-12-12 MED ORDER — TRIAMCINOLONE ACETONIDE 10 MG/ML IJ SUSP
10.0000 mg | Freq: Once | INTRAMUSCULAR | Status: AC
  Administered 2013-12-12: 10 mg

## 2013-12-12 NOTE — Progress Notes (Signed)
   Subjective:    Patient ID: Regina [name], female    DOB: 26-Nov-1940, 73 y.o.   MRN: [id number]  HPI Comments: Extreme pain on the right top of foot , it has been going on for two weeks   Foot Pain      Review of Systems  All other systems reviewed and are negative.      Objective:   Physical Exam        Assessment & Plan:

## 2013-12-12 NOTE — Progress Notes (Signed)
Subjective:     Patient ID: Regina [name], female   DOB: 1940/09/27, 73 y.o.   MRN: [id number]  HPIpatient presents stating I'm getting pain on the top of my right foot that's been present for a few weeks and making it hard for me to walk. I do not remember an injury and I've tried ice and supportive shoes   Review of Systems  All other systems reviewed and are negative.      Objective:   Physical Exam  Constitutional: She is oriented to person, place, and time.  Musculoskeletal: Normal range of motion.  Neurological: She is oriented to person, place, and time.  Skin: Skin is warm.  Nursing note and vitals reviewed. neurovascular status found to be intact with muscle strength adequate and range of motion subtalar and midtarsal joint within normal limits. Muscle strength of the anterior tibial was found to be normal and I noted there to be inflammation at the insertion of the anterior tibia and slightly proximal first metatarsocuneiform joint. No indications of muscle function loss     Assessment:     Acute tendinitis of the anterior tibial tendon near its insertion into the cuneiform base of first metatarsal    Plan:     H&P and x-rays reviewed with patient. Did a careful sheath injection 3 mg Kenalog 5 mg Xylocaine and advised on ice therapy and dispensed fascial brace with instructions on usage. Reappoint to recheck again in

## 2013-12-19 ENCOUNTER — Ambulatory Visit: Payer: Medicare Other | Admitting: Podiatry

## 2013-12-20 ENCOUNTER — Emergency Department (HOSPITAL_COMMUNITY): Payer: Medicare HMO

## 2013-12-20 ENCOUNTER — Encounter (HOSPITAL_COMMUNITY): Payer: Self-pay | Admitting: Emergency Medicine

## 2013-12-20 ENCOUNTER — Emergency Department (HOSPITAL_COMMUNITY)
Admission: EM | Admit: 2013-12-20 | Discharge: 2013-12-20 | Disposition: A | Discharge status: Home / Self Care | Payer: Medicare HMO | Attending: Emergency Medicine | Admitting: Emergency Medicine

## 2013-12-20 DIAGNOSIS — E780 Pure hypercholesterolemia: Secondary | ICD-10-CM | POA: Insufficient documentation

## 2013-12-20 DIAGNOSIS — Z8742 Personal history of other diseases of the female genital tract: Secondary | ICD-10-CM | POA: Insufficient documentation

## 2013-12-20 DIAGNOSIS — Z7982 Long term (current) use of aspirin: Secondary | ICD-10-CM | POA: Diagnosis not present

## 2013-12-20 DIAGNOSIS — I10 Essential (primary) hypertension: Secondary | ICD-10-CM | POA: Diagnosis not present

## 2013-12-20 DIAGNOSIS — M545 Low back pain, unspecified: Secondary | ICD-10-CM

## 2013-12-20 DIAGNOSIS — Z8701 Personal history of pneumonia (recurrent): Secondary | ICD-10-CM | POA: Insufficient documentation

## 2013-12-20 DIAGNOSIS — M1990 Unspecified osteoarthritis, unspecified site: Secondary | ICD-10-CM | POA: Diagnosis not present

## 2013-12-20 DIAGNOSIS — Z79899 Other long term (current) drug therapy: Secondary | ICD-10-CM | POA: Insufficient documentation

## 2013-12-20 DIAGNOSIS — G4730 Sleep apnea, unspecified: Secondary | ICD-10-CM | POA: Insufficient documentation

## 2013-12-20 DIAGNOSIS — Z9981 Dependence on supplemental oxygen: Secondary | ICD-10-CM | POA: Insufficient documentation

## 2013-12-20 LAB — URINALYSIS, ROUTINE W REFLEX MICROSCOPIC
BILIRUBIN URINE: NEGATIVE
Glucose, UA: NEGATIVE mg/dL
Hgb urine dipstick: NEGATIVE
KETONES UR: NEGATIVE mg/dL
Leukocytes, UA: NEGATIVE
NITRITE: NEGATIVE
PROTEIN: NEGATIVE mg/dL
Specific Gravity, Urine: 1.006 (ref 1.005–1.030)
UROBILINOGEN UA: 0.2 mg/dL (ref 0.0–1.0)
pH: 6 (ref 5.0–8.0)

## 2013-12-20 MED ORDER — CYCLOBENZAPRINE HCL 10 MG PO TABS
10.0000 mg | ORAL_TABLET | Freq: Once | ORAL | Status: AC
Start: 2013-12-20 — End: 2013-12-20
  Administered 2013-12-20: 10 mg via ORAL
  Filled 2013-12-20: qty 1

## 2013-12-20 MED ORDER — HYDROCODONE-ACETAMINOPHEN 5-325 MG PO TABS
2.0000 | ORAL_TABLET | Freq: Once | ORAL | Status: AC
  Administered 2013-12-20: 2 via ORAL
  Filled 2013-12-20: qty 2

## 2013-12-20 MED ORDER — CYCLOBENZAPRINE HCL 10 MG PO TABS: 10.0000 mg | ORAL_TABLET | Freq: Three times a day (TID) | ORAL | Status: DC | PRN

## 2013-12-20 MED ORDER — HYDROCODONE-ACETAMINOPHEN 5-325 MG PO TABS: 1.0000 | ORAL_TABLET | Freq: Four times a day (QID) | ORAL | Status: DC | PRN

## 2013-12-20 NOTE — Discharge Instructions (Signed)
Back Pain, Adult Low back pain is very common. About 1 in 5 people have back pain.The cause of low back pain is rarely dangerous. The pain often gets better over time.About half of people with a sudden onset of back pain feel better in just 2 weeks. About 8 in 10 people feel better by 6 weeks.  CAUSES Some common causes of back pain include:  Strain of the muscles or ligaments supporting the spine.  Wear and tear (degeneration) of the spinal discs.  Arthritis.  Direct injury to the back. DIAGNOSIS Most of the time, the direct cause of low back pain is not known.However, back pain can be treated effectively even when the exact cause of the pain is unknown.Answering your caregiver's questions about your overall health and symptoms is one of the most accurate ways to make sure the cause of your pain is not dangerous. If your caregiver needs more information, he or she may order lab work or imaging tests (X-rays or MRIs).However, even if imaging tests show changes in your back, this usually does not require surgery. HOME CARE INSTRUCTIONS For many people, back pain returns.Since low back pain is rarely dangerous, it is often a condition that people can learn to manageon their own.   Remain active. It is stressful on the back to sit or stand in one place. Do not sit, drive, or stand in one place for more than 30 minutes at a time. Take short walks on level surfaces as soon as pain allows.Try to increase the length of time you walk each day.  Do not stay in bed.Resting more than 1 or 2 days can delay your recovery.  Do not avoid exercise or work.Your body is made to move.It is not dangerous to be active, even though your back may hurt.Your back will likely heal faster if you return to being active before your pain is gone.  Pay attention to your body when you bend and lift. Many people have less discomfortwhen lifting if they bend their knees, keep the load close to their bodies,and  avoid twisting. Often, the most comfortable positions are those that put less stress on your recovering back.  Find a comfortable position to sleep. Use a firm mattress and lie on your side with your knees slightly bent. If you lie on your back, put a pillow under your knees.  Only take over-the-counter or prescription medicines as directed by your caregiver. Over-the-counter medicines to reduce pain and inflammation are often the most helpful.Your caregiver may prescribe muscle relaxant drugs.These medicines help dull your pain so you can more quickly return to your normal activities and healthy exercise.  Put ice on the injured area.  Put ice in a plastic bag.  Place a towel between your skin and the bag.  Leave the ice on for 15-20 minutes, 03-04 times a day for the first 2 to 3 days. After that, ice and heat may be alternated to reduce pain and spasms.  Ask your caregiver about trying back exercises and gentle massage. This may be of some benefit.  Avoid feeling anxious or stressed.Stress increases muscle tension and can worsen back pain.It is important to recognize when you are anxious or stressed and learn ways to manage it.Exercise is a great option. SEEK MEDICAL CARE IF:  You have pain that is not relieved with rest or medicine.  You have pain that does not improve in 1 week.  You have new symptoms.  You are generally not feeling well. SEEK   IMMEDIATE MEDICAL CARE IF:   You have pain that radiates from your back into your legs.  You develop new bowel or bladder control problems.  You have unusual weakness or numbness in your arms or legs.  You develop nausea or vomiting.  You develop abdominal pain.  You feel faint. Document Released: 01/25/2005 Document Revised: 07/27/2011 Document Reviewed: 05/29/2013 [employer] Patient Information 2015 [employer]. This information is not intended to replace advice given to you by your health care provider. Make sure you  discuss any questions you have with your health care provider.  

## 2013-12-20 NOTE — ED Notes (Addendum)
Pt reports lower back pain x 2 days. Denies fall or injury or radiation. Says "it hurts worse to walk". Denies bowel or bladder issues but does have increase urinary freq.

## 2013-12-20 NOTE — ED Provider Notes (Signed)
CSN: [id number]     Arrival date & time 12/20/13  0919 History   First MD Initiated Contact with Patient 12/20/13 1020     Chief Complaint  Patient presents with  . Back Pain     (Consider location/radiation/quality/duration/timing/severity/associated sxs/prior Treatment) Patient is a 73 y.o. female presenting with back pain. The history is provided by the patient.  Back Pain Location:  Lumbar spine Quality:  Burning Radiates to:  Does not radiate Pain severity:  Moderate Pain is:  Same all the time Onset quality:  Gradual Duration:  2 days Timing:  Constant Progression:  Worsening Chronicity:  New Context: not physical stress, not recent illness and not recent injury   Relieved by:  Nothing Worsened by:  Movement (walking) Associated symptoms: no abdominal pain, no chest pain and no fever     Past Medical History  Diagnosis Date  . IC (interstitial cystitis)   . Fibroid   . PAD (peripheral artery disease)   . Hypertension   . Elevated cholesterol   . Arthritis   . Dysrhythmia     hx svt 04/ ablation no problms since per patient  . Pneumonia     hx  . Apnea, sleep     cpap use irregulary   Past Surgical History  Procedure Laterality Date  . Abdominal hysterectomy      TAH LSO  . Appendectomy    . Cardiac electrophysiology study and ablation    . Breast surgery      Lumpectomy-Benign  . Breast surgery      Biopsy  . Oophorectomy      LSO  . Tonsillectomy    . Hemorrhoid surgery    . Eye surgery Bilateral     cat  . Breast lumpectomy with needle localization Right 11/22/2012    Procedure: BREAST LUMPECTOMY WITH NEEDLE LOCALIZATION;  Fragoso: Harl Bowie, MD;  Location: [place];  Service: General;  Laterality: Right;   Family History  Problem Relation Age of Onset  . Diabetes Mother   . Hypertension Mother   . Stroke Mother   . Cancer Father     Multiple Myloma  . Cancer Daughter 85    OVARIAN CANCER STAGE 3    History  Substance Use Topics   . Smoking status: Never Smoker   . Smokeless tobacco: Never Used  . Alcohol Use: No   OB History    Gravida Para Term Preterm AB TAB SAB Ectopic Multiple Living   2 2 2       2      Review of Systems  Constitutional: Negative for fever and chills.  Respiratory: Negative for cough and shortness of breath.   Cardiovascular: Negative for chest pain and leg swelling.  Gastrointestinal: Negative for vomiting and abdominal pain.  Musculoskeletal: Positive for back pain.  All other systems reviewed and are negative.     Allergies  Other  Home Medications   Prior to Admission medications   Medication Sig Start Date End Date Taking? Authorizing Provider  amLODipine (NORVASC) 5 MG tablet Take 5 mg by mouth daily.   Yes Historical Provider, MD  metoprolol tartrate (LOPRESSOR) 25 MG tablet Take 25 mg by mouth 2 (two) times daily.   Yes Historical Provider, MD  aspirin 81 MG tablet Take 81 mg by mouth daily.    Historical Provider, MD  BIOTIN FORTE PO Take 1 capsule by mouth 3 (three) times daily.     Historical Provider, MD  CALCIUM PO Take 1  tablet by mouth 2 (two) times daily.    Historical Provider, MD  cholecalciferol (VITAMIN D) 1000 UNITS tablet Take 1,000 Units by mouth daily.    Historical Provider, MD  diclofenac sodium (VOLTAREN) 1 % GEL Apply 2 g topically 2 (two) times daily as needed (muscle/joint pain).    Historical Provider, MD  Glucosamine HCl 1000 MG TABS Take 1,000 mg by mouth 2 (two) times daily.    Historical Provider, MD  lidocaine (XYLOCAINE) 5 % ointment Apply 1 application topically 2 (two) times daily as needed (arthritis).    Historical Provider, MD  Multiple Vitamin (MULTIVITAMIN) LIQD Take 5 mLs by mouth daily.    Historical Provider, MD  Multiple Vitamin (MULTIVITAMIN) tablet Take 1 tablet by mouth daily.    Historical Provider, MD  NONFORMULARY OR COMPOUNDED ITEM Estradiol .02% 1 ML Prefilled Applicator Sig: apply vaginally twice a week #90 Day Supply with  4 refills 03/06/13   Terrance Mass, MD  rosuvastatin (CRESTOR) 10 MG tablet Take 10 mg by mouth daily.    Historical Provider, MD   BP 156/90 mmHg  Pulse 55  Temp(Src) 98.5 F (36.9 C) (Oral)  Resp 16  SpO2 100% Physical Exam  Constitutional: She is oriented to person, place, and time. She appears well-developed and well-nourished. No distress.  HENT:  Head: Normocephalic and atraumatic.  Mouth/Throat: Oropharynx is clear and moist.  Eyes: EOM are normal. Pupils are equal, round, and reactive to light.  Neck: Normal range of motion. Neck supple.  Cardiovascular: Normal rate and regular rhythm.  Exam reveals no friction rub.   No murmur heard. Pulmonary/Chest: Effort normal and breath sounds normal. No respiratory distress. She has no wheezes. She has no rales.  Abdominal: Soft. She exhibits no distension. There is no tenderness. There is no rebound.  Musculoskeletal: She exhibits no edema.       Lumbar back: She exhibits decreased range of motion. She exhibits no tenderness, no bony tenderness, no swelling and no edema.  Neurological: She is alert and oriented to person, place, and time.  Skin: She is not diaphoretic.  Nursing note and vitals reviewed.   ED Course  Procedures (including critical care time) Labs Review Labs Reviewed  URINALYSIS, ROUTINE W REFLEX MICROSCOPIC    Imaging Review No results found.   EKG Interpretation None     EMERGENCY DEPARTMENT [place] ABD/AORTA EXAM Study: Limited Ultrasound of the Abdominal Aorta.  INDICATIONS:Back pain and Age>55 Indication: Multiple views of the abdominal aorta are obtained from the diaphragmatic hiatus to the aortic bifurcation in transverse and sagittal planes with a multi- Frequency probe.  PERFORMED BY: Myself  IMAGES ARCHIVED?: Yes  FINDINGS: Free fluid absent  LIMITATIONS:  none  INTERPRETATION:  No abdominal aortic aneurysm and Abdominal free fluid absent  COMMENT:  Max diameter 1.7 cm  MDM   Final  diagnoses:  Lower back pain    73 year old female here with lower back pain for the past few days. No pain. Having difficulty with walking. She's having some urinary frequency but no dysuria, hematuria.  No urgency. Exam is benign. No palpable lower back tenderness. Bedside abdominal ultrasound normal without evidence of aneurysm. Will get x-ray of her lower back, check her urine, give pain meds and muscle relaxers. Xray ok. Urine ok. Feeling better with meds, given vicodin and flexeril to go home, instructed to f/u with PCP.  Evelina Bucy, MD 12/20/13 [phone]

## 2013-12-24 ENCOUNTER — Ambulatory Visit (INDEPENDENT_AMBULATORY_CARE_PROVIDER_SITE_OTHER): Payer: Medicare HMO | Admitting: Podiatry

## 2013-12-24 ENCOUNTER — Encounter: Payer: Self-pay | Admitting: Podiatry

## 2013-12-24 VITALS — BP 170/74 | HR 59 | Resp 16

## 2013-12-24 DIAGNOSIS — M779 Enthesopathy, unspecified: Secondary | ICD-10-CM

## 2013-12-24 NOTE — Progress Notes (Signed)
Subjective:     Patient ID: Regina [name], female   DOB: Nov 23, 1940, 73 y.o.   MRN: [id number]  HPIpatient states my right foot is feeling quite a bit better with minimal discomfort upon ambulation and able to walk distances without pain   Review of Systems     Objective:   Physical Exam Neurovascular status unchanged with diminished tendinitis of the right foot at the insertion of the anterior tibial tendon into the base first metatarsal cuneiform with no indications of tendon instability    Assessment:     Improve tendinitis of the right foot anterior tibial    Plan:     Advised on physical therapy anti-inflammatories and supportive shoes. Reappoint if symptoms persist

## 2014-02-18 ENCOUNTER — Encounter: Payer: Self-pay | Admitting: Gastroenterology

## 2014-03-27 ENCOUNTER — Encounter: Payer: Self-pay | Admitting: Gastroenterology

## 2014-04-23 ENCOUNTER — Ambulatory Visit: Payer: Medicare Other | Admitting: Podiatry

## 2014-05-14 ENCOUNTER — Ambulatory Visit (AMBULATORY_SURGERY_CENTER): Payer: Self-pay | Admitting: *Deleted

## 2014-05-14 VITALS — Ht 65.0 in | Wt 175.0 lb

## 2014-05-14 DIAGNOSIS — Z83719 Family history of colon polyps, unspecified: Secondary | ICD-10-CM

## 2014-05-14 DIAGNOSIS — Z8371 Family history of colonic polyps: Secondary | ICD-10-CM

## 2014-05-14 MED ORDER — NA SULFATE-K SULFATE-MG SULF 17.5-3.13-1.6 GM/177ML PO SOLN: 1.0000 | Freq: Once | ORAL | Status: DC

## 2014-05-14 NOTE — Progress Notes (Signed)
No allergies to soy or eggs No home O2 No problems with sedation in the past  No diet medications

## 2014-05-16 ENCOUNTER — Telehealth: Payer: Self-pay | Admitting: Gastroenterology

## 2014-05-16 NOTE — Telephone Encounter (Signed)
Called patient a second time, this time using mobile number. Patient prescribed suprep, too expensive. We currently do not have any prep samples but 27 are expected to come next week. Asked patient to call back midweek to see if we have received any samples that we may be able to give her one. Also explained that if we do not receive any samples in a timely manner before her 05/28/14 procedure, we could switch her to Miralax and give her different instructions.

## 2014-05-16 NOTE — Telephone Encounter (Signed)
Returned phone call to Ms. Vanschaick, no answer. Message left on machine to call [phone].

## 2014-05-24 ENCOUNTER — Telehealth: Payer: Self-pay | Admitting: Gastroenterology

## 2014-05-24 DIAGNOSIS — Z8371 Family history of colonic polyps: Secondary | ICD-10-CM

## 2014-05-24 MED ORDER — NA SULFATE-K SULFATE-MG SULF 17.5-3.13-1.6 GM/177ML PO SOLN: ORAL | Status: DC

## 2014-05-24 NOTE — Telephone Encounter (Signed)
Called patient to let her know I will leave a prep up front for her to pick up. Patient's states she will come by the office on Monday to get free Suprep.

## 2014-05-28 ENCOUNTER — Ambulatory Visit (AMBULATORY_SURGERY_CENTER): Payer: Medicare HMO | Admitting: Gastroenterology

## 2014-05-28 ENCOUNTER — Encounter: Payer: Self-pay | Admitting: Gastroenterology

## 2014-05-28 VITALS — BP 129/85 | HR 53 | Resp 15 | Ht 65.0 in | Wt 175.0 lb

## 2014-05-28 DIAGNOSIS — Z83719 Family history of colon polyps, unspecified: Secondary | ICD-10-CM

## 2014-05-28 DIAGNOSIS — Z8371 Family history of colonic polyps: Secondary | ICD-10-CM | POA: Diagnosis present

## 2014-05-28 DIAGNOSIS — Z1211 Encounter for screening for malignant neoplasm of colon: Secondary | ICD-10-CM | POA: Diagnosis not present

## 2014-05-28 MED ORDER — SODIUM CHLORIDE 0.9 % IV SOLN: 500.0000 mL | INTRAVENOUS | Status: DC

## 2014-05-28 NOTE — Progress Notes (Signed)
Report to PACU, RN, vss, BBS= Clear.  

## 2014-05-28 NOTE — Op Note (Signed)
[place]  [address] [place], [address]   COLONOSCOPY PROCEDURE REPORT  PATIENT: [name], Regina Gorey  MR#: [id number] BIRTHDATE: [birth year] , 73  yrs. old GENDER: female ENDOSCOPIST: Ladene Artist, MD, [facility] REFERRED GN:FAOZH, Caren Griffins PROCEDURE DATE:  05/28/2014 PROCEDURE:   Colonoscopy, screening First Screening Colonoscopy - Avg.  risk and is 50 yrs.  old or older - No.  Prior Negative Screening - Now for repeat screening. Less than 10 yrs Prior Negative Screening - Now for repeat screening.  Above average risk  History of Adenoma - Now for follow-up colonoscopy & has been > or = to 3 yrs.  N/A ASA CLASS:   Class III INDICATIONS:Screening for colonic neoplasia and FH Colon Adenoma. MEDICATIONS: Monitored anesthesia care and Propofol 150 mg IV DESCRIPTION OF PROCEDURE:   After the risks benefits and alternatives of the procedure were thoroughly explained, informed consent was obtained.  The digital rectal exam revealed no abnormalities of the rectum.   The LB [id number]  endoscope was introduced through the anus and advanced to the cecum, which was identified by both the appendix and ileocecal valve. No adverse events experienced.   The quality of the prep was excellent. (MoviPrep was used)  The instrument was then slowly withdrawn as the colon was fully examined.    COLON FINDINGS: There was moderate diverticulosis noted in the sigmoid colon, descending colon, and transverse colon.   The examination was otherwise normal.  Retroflexed views revealed internal Grade I hemorrhoids. The time to cecum = 2.4 Withdrawal time = 7.8   The scope was withdrawn and the procedure completed. COMPLICATIONS: There were no immediate complications.  ENDOSCOPIC IMPRESSION: 1.   Moderate diverticulosis in the sigmoid colon, descending colon, and transverse colon 2.   Grade l internal hemorrhoids  RECOMMENDATIONS: 1.  High fiber diet with liberal fluid  intake. 2.  Given your age and no polyps on colonoscopy today, you will not need another colonoscopy for colon cancer screening or polyp surveillance.  These types of tests usually stop around the age 36.   eSigned:  Ladene Artist, MD, [facility] 05/28/2014 9:10 AM

## 2014-05-28 NOTE — Patient Instructions (Signed)
YOU HAD AN ENDOSCOPIC PROCEDURE TODAY AT [facility]:   Refer to the procedure report that was given to you for any specific questions about what was found during the examination.  If the procedure report does not answer your questions, please call your gastroenterologist to clarify.  If you requested that your care partner not be given the details of your procedure findings, then the procedure report has been included in a sealed envelope for you to review at your convenience later.  YOU SHOULD EXPECT: Some feelings of bloating in the abdomen. Passage of more gas than usual.  Walking can help get rid of the air that was put into your GI tract during the procedure and reduce the bloating. If you had a lower endoscopy (such as a colonoscopy or flexible sigmoidoscopy) you may notice spotting of blood in your stool or on the toilet paper. If you underwent a bowel prep for your procedure, you may not have a normal bowel movement for a few days.  Please Note:  You might notice some irritation and congestion in your nose or some drainage.  This is from the oxygen used during your procedure.  There is no need for concern and it should clear up in a day or so.  SYMPTOMS TO REPORT IMMEDIATELY:   Following lower endoscopy (colonoscopy or flexible sigmoidoscopy):  Excessive amounts of blood in the stool  Significant tenderness or worsening of abdominal pains  Swelling of the abdomen that is new, acute  Fever of 100F or higher    For urgent or emergent issues, a gastroenterologist can be reached at any hour by calling [phone].   DIET: Your first meal following the procedure should be a small meal and then it is ok to progress to your normal diet. Heavy or fried foods are harder to digest and may make you feel nauseous or bloated.  Likewise, meals heavy in dairy and vegetables can increase bloating.  Drink plenty of fluids but you should avoid alcoholic beverages for 24  hours.  ACTIVITY:  You should plan to take it easy for the rest of today and you should NOT DRIVE or use heavy machinery until tomorrow (because of the sedation medicines used during the test).    FOLLOW UP: Our staff will call the number listed on your records the next business day following your procedure to check on you and address any questions or concerns that you may have regarding the information given to you following your procedure. If we do not reach you, we will leave a message.  However, if you are feeling well and you are not experiencing any problems, there is no need to return our call.  We will assume that you have returned to your regular daily activities without incident.  If any biopsies were taken you will be contacted by phone or by letter within the next 1-3 weeks.  Please call us at [phone] if you have not heard about the biopsies in 3 weeks.    SIGNATURES/CONFIDENTIALITY: You and/or your care partner have signed paperwork which will be entered into your electronic medical record.  These signatures attest to the fact that that the information above on your After Visit Summary has been reviewed and is understood.  Full responsibility of the confidentiality of this discharge information lies with you and/or your care-partner.   Resume medications. Information given on diverticulosis, hemorrhoids and high fiber diet with discharge instructions.

## 2014-05-29 ENCOUNTER — Telehealth: Payer: Self-pay

## 2014-05-29 NOTE — Telephone Encounter (Signed)
  Follow up Call-  Call back number 05/28/2014  Post procedure Call Back phone  # [phone]  Permission to leave phone message No     Patient questions:  Do you have a fever, pain , or abdominal swelling? No. Pain Score  0 *  Have you tolerated food without any problems? Yes.    Have you been able to return to your normal activities? Yes.    Do you have any questions about your discharge instructions: Diet   No. Medications  No. Follow up visit  No.  Do you have questions or concerns about your Care? No.  Actions: * If pain score is 4 or above: No action needed, pain <4.

## 2014-06-12 ENCOUNTER — Encounter: Payer: Self-pay | Admitting: Gynecology

## 2014-07-11 ENCOUNTER — Encounter: Payer: Self-pay | Admitting: Gastroenterology

## 2014-07-15 ENCOUNTER — Telehealth: Payer: Self-pay

## 2014-07-15 NOTE — Telephone Encounter (Signed)
I would follow up with Dr. Rush Farmer

## 2014-07-15 NOTE — Telephone Encounter (Signed)
In Oct 2014 patient had breast surgery with Dr. Rush Farmer.  Just recently started having problems with that breast. She said the incision started to itching and sting. Then it because very sore and now is painful. She questions should she come see you for this or have Dr. Rush Farmer check it?

## 2014-07-15 NOTE — Telephone Encounter (Signed)
Patient informed. Ph # provided.

## 2015-06-23 ENCOUNTER — Other Ambulatory Visit: Payer: Self-pay | Admitting: Cardiology

## 2015-06-23 DIAGNOSIS — R079 Chest pain, unspecified: Secondary | ICD-10-CM

## 2015-06-30 ENCOUNTER — Encounter (HOSPITAL_COMMUNITY)
Admission: RE | Admit: 2015-06-30 | Discharge: 2015-06-30 | Disposition: A | Discharge status: Home / Self Care | Payer: PPO | Source: Ambulatory Visit | Attending: Cardiology | Admitting: Cardiology

## 2015-06-30 DIAGNOSIS — R079 Chest pain, unspecified: Secondary | ICD-10-CM

## 2015-06-30 MED ORDER — TECHNETIUM TC 99M TETROFOSMIN IV KIT
10.0000 | PACK | Freq: Once | INTRAVENOUS | Status: AC | PRN
  Administered 2015-06-30: 10 via INTRAVENOUS

## 2015-06-30 MED ORDER — REGADENOSON 0.4 MG/5ML IV SOLN
INTRAVENOUS | Status: AC
  Filled 2015-06-30: qty 5

## 2015-06-30 MED ORDER — REGADENOSON 0.4 MG/5ML IV SOLN
0.4000 mg | Freq: Once | INTRAVENOUS | Status: AC
  Administered 2015-06-30: 0.4 mg via INTRAVENOUS

## 2015-06-30 MED ORDER — TECHNETIUM TC 99M TETROFOSMIN IV KIT
30.0000 | PACK | Freq: Once | INTRAVENOUS | Status: AC | PRN
  Administered 2015-06-30: 30 via INTRAVENOUS

## 2015-07-02 ENCOUNTER — Encounter: Payer: Self-pay | Admitting: Gynecology

## 2015-07-04 ENCOUNTER — Encounter: Payer: Self-pay | Admitting: Gynecology

## 2015-08-12 IMAGING — CR DG KNEE AP/LAT W/ SUNRISE*L*
3 series · 3 of 3 positions shown · non-contrast
Comparison: None.

CLINICAL DATA: Left knee pain

DG KNEE - 3 VIEWS

[view not recorded (1 of 3)]
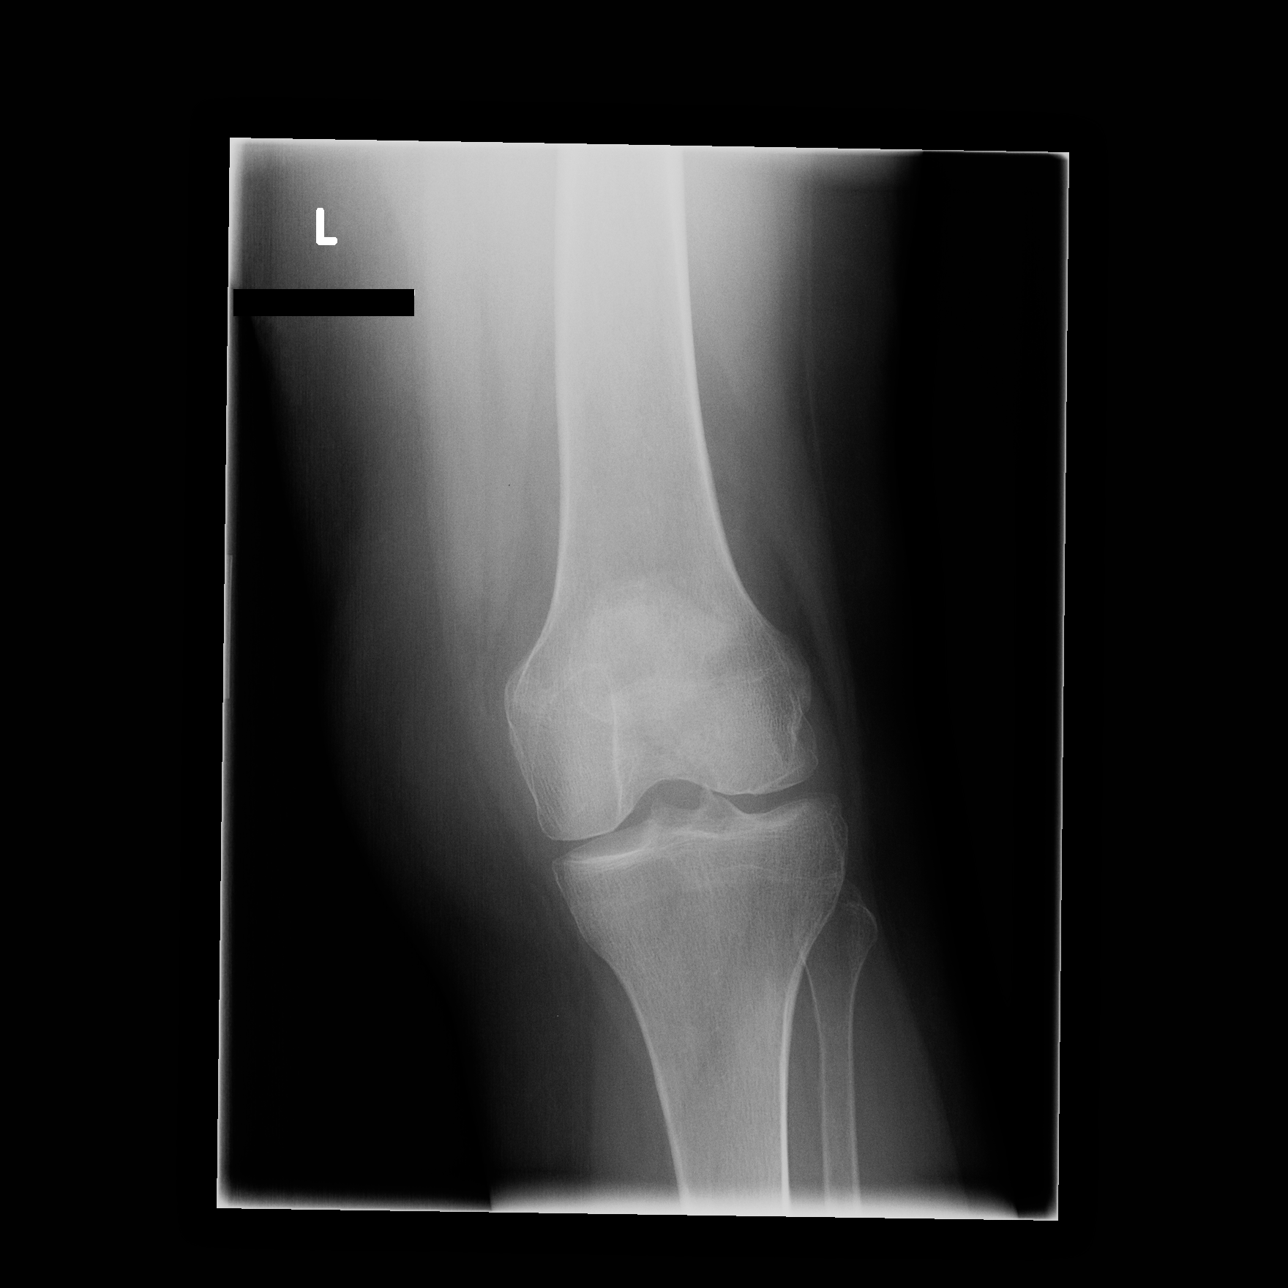

[view not recorded (2 of 3)]
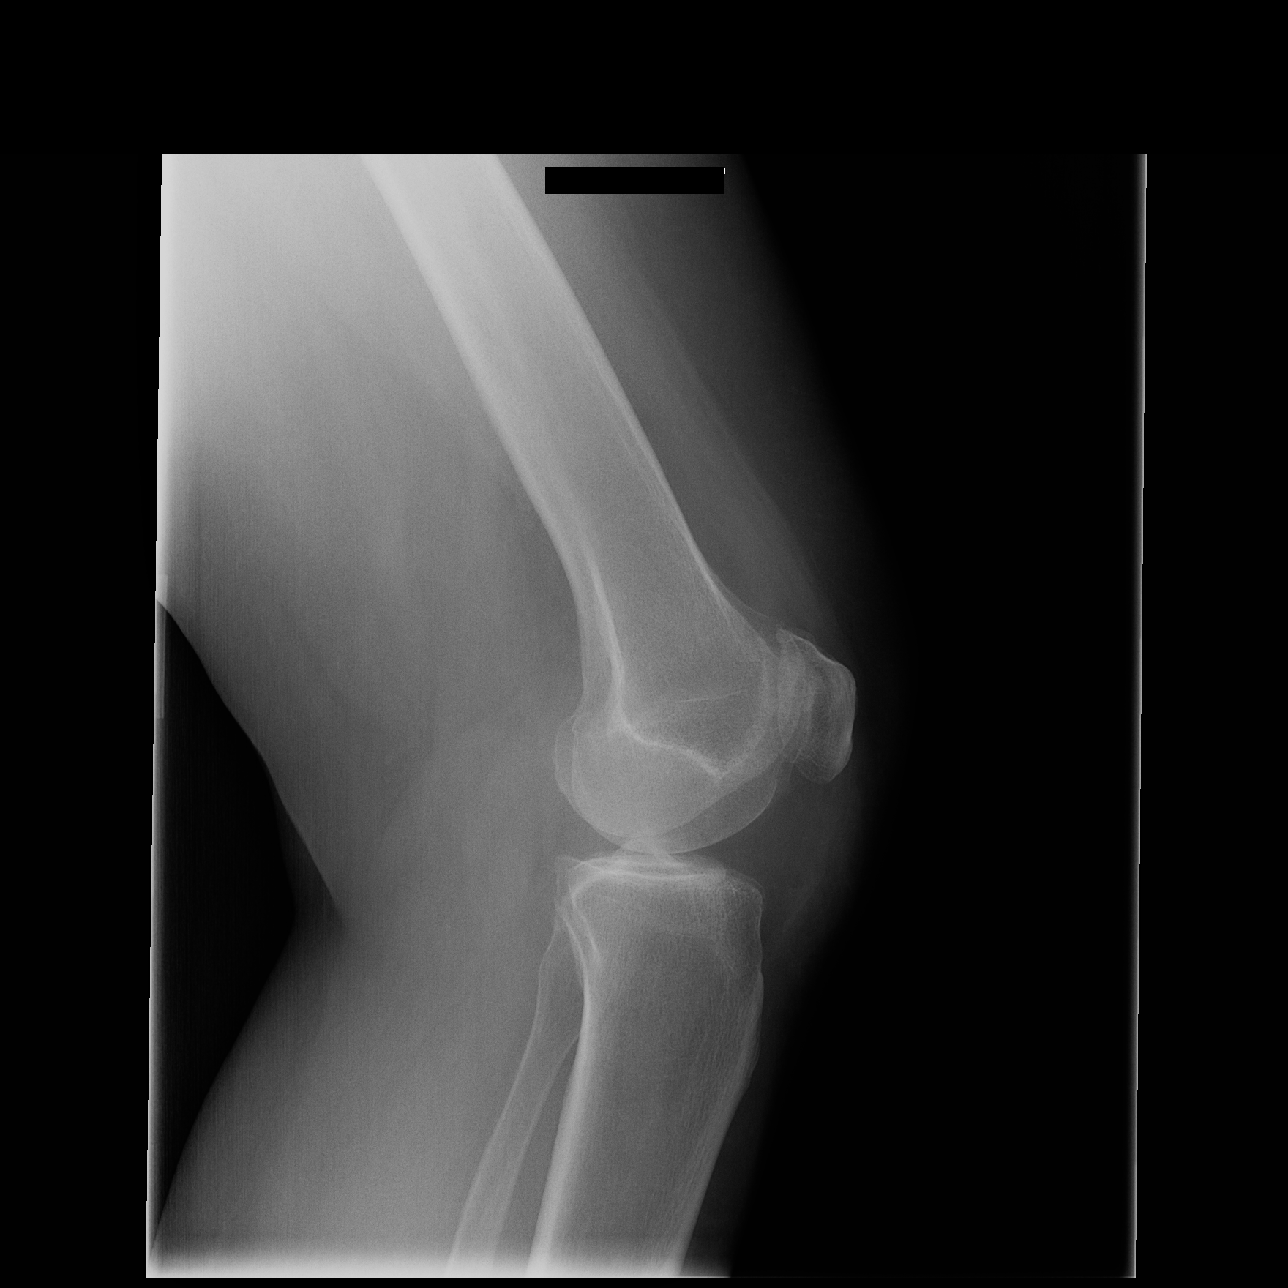

[view not recorded (3 of 3)]
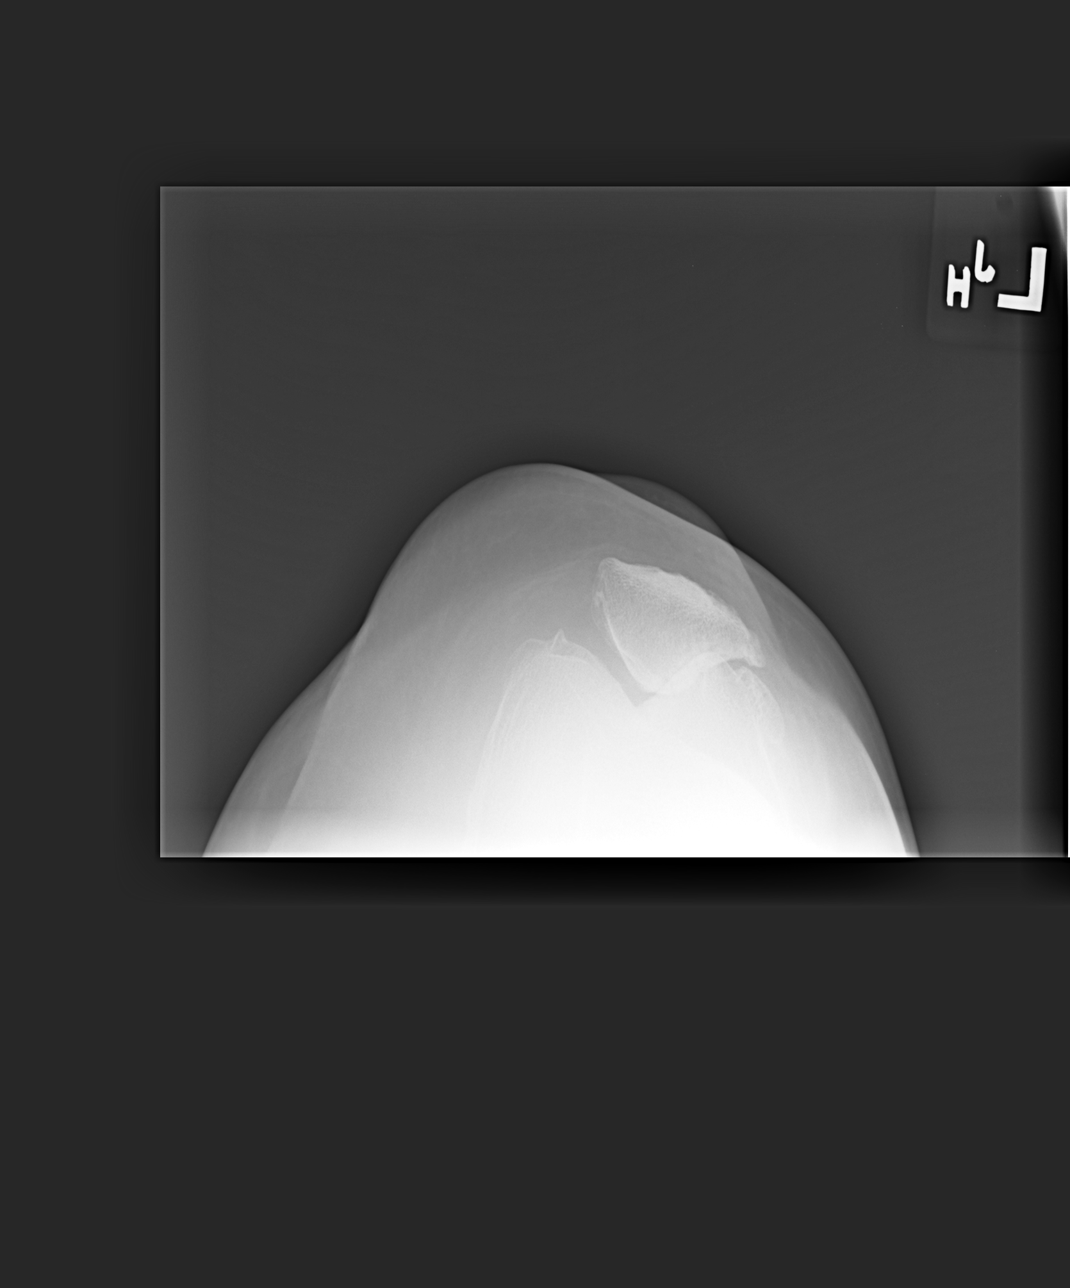

[3 of 3 positions shown; findings below may reference images not displayed]

FINDINGS: No fracture or dislocation.

There is significant patellofemoral joint space compartment
narrowing, mostly of the lateral portion, as well as marginal
osteophytes most promptly from the lateral margin of the patella.
The medial and lateral femorotibial joint space compartments are
well preserved.  There is minimal spurring from the lateral
compartment and tibial spine.

A minimal joint effusion is suggested.  The soft tissues are
unremarkable.
IMPRESSION: No fracture or acute finding.

Advanced degenerative changes of the patellofemoral joint space
compartment.

## 2015-11-17 ENCOUNTER — Ambulatory Visit: Payer: PPO | Admitting: Gastroenterology

## 2015-12-15 ENCOUNTER — Ambulatory Visit (INDEPENDENT_AMBULATORY_CARE_PROVIDER_SITE_OTHER): Payer: PPO | Admitting: Gastroenterology

## 2015-12-15 ENCOUNTER — Encounter: Payer: Self-pay | Admitting: Gastroenterology

## 2015-12-15 ENCOUNTER — Encounter (INDEPENDENT_AMBULATORY_CARE_PROVIDER_SITE_OTHER): Payer: Self-pay

## 2015-12-15 VITALS — BP 134/74 | HR 72 | Ht 65.0 in | Wt 185.4 lb

## 2015-12-15 DIAGNOSIS — R152 Fecal urgency: Secondary | ICD-10-CM | POA: Diagnosis not present

## 2015-12-15 DIAGNOSIS — R159 Full incontinence of feces: Secondary | ICD-10-CM

## 2015-12-15 DIAGNOSIS — R197 Diarrhea, unspecified: Secondary | ICD-10-CM | POA: Diagnosis not present

## 2015-12-15 NOTE — Progress Notes (Signed)
    History of Present Illness: This is a 75 year old female who relates frequent urgent morning diarrhea with occasional incontinence. She states she has had difficulties with frequent urgent morning diarrhea. Occasionally she will have diarrhea at other times. She states she has started having Cheerios on a daily basis which has firmed her stools she relates no recent episodes of incontinence. Denies weight loss, abdominal pain, constipation, change in stool caliber, melena, hematochezia, nausea, vomiting, dysphagia, reflux symptoms, chest pain.  COLONOSCOPY IMPRESSION 05/2014: 1. Moderate diverticulosis in the sigmoid colon, descending colon, and transverse colon 2. Grade l internal hemorrhoids  Current Medications, Allergies, Past Medical History, Past Surgical History, Family History and Social History were reviewed in [employer] record.  Physical Exam: General: Well developed, well nourished, no acute distress Head: Normocephalic and atraumatic Eyes:  sclerae anicteric, EOMI Ears: Normal auditory acuity Mouth: No deformity or lesions Lungs: Clear throughout to auscultation Heart: Regular rate and rhythm; no murmurs, rubs or bruits Abdomen: Soft, non tender and non distended. No masses, hepatosplenomegaly or hernias noted. Normal Bowel sounds Rectal: no lesions, no tenderness, normal sphincter tone and slightly reduced squeeze, heme negative stool Musculoskeletal: Symmetrical with no gross deformities  Pulses:  Normal pulses noted Extremities: No clubbing, cyanosis, edema or deformities noted Neurological: Alert oriented x 4, grossly nonfocal Psychological:  Alert and cooperative. Normal mood and affect  Assessment and Recommendations:  1. Diarrhea, occasional fecal incontinence. Mild reduced anal squeeze. Increase dietary fiber. Daily Cheerios seems to be effective so she will continue with this dietary regimen. Daily Kegel exercises. May use Imodium at  bedtime if symptoms return despite dietary adjustments. If all above is not effective she is advised to return for follow-up.

## 2015-12-15 NOTE — Patient Instructions (Signed)
Take Imodium at bedtime as needed if your diarrhea is worse in the morning.   Kegel Exercises The goal of Kegel exercises is to isolate and exercise your pelvic floor muscles. These muscles act as a hammock that supports the rectum, vagina, small intestine, and uterus. As the muscles weaken, the hammock sags and these organs are displaced from their normal positions. Kegel exercises can strengthen your pelvic floor muscles and help you to improve bladder and bowel control, improve sexual response, and help reduce many problems and some discomfort during pregnancy. Kegel exercises can be done anywhere and at any time. HOW TO PERFORM KEGEL EXERCISES 1. Locate your pelvic floor muscles. To do this, squeeze (contract) the muscles that you use when you try to stop the flow of urine. You will feel a tightness in the vaginal area (women) and a tight lift in the rectal area (men and women). 2. When you begin, contract your pelvic muscles tight for 2-5 seconds, then relax them for 2-5 seconds. This is one set. Do 4-5 sets with a short pause in between. 3. Contract your pelvic muscles for 8-10 seconds, then relax them for 8-10 seconds. Do 4-5 sets. If you cannot contract your pelvic muscles for 8-10 seconds, try 5-7 seconds and work your way up to 8-10 seconds. Your goal is 4-5 sets of 10 contractions each day. Keep your stomach, buttocks, and legs relaxed during the exercises. Perform sets of both short and long contractions. Vary your positions. Perform these contractions 3-4 times per day. Perform sets while you are:   Lying in bed in the morning.  Standing at lunch.  Sitting in the late afternoon.  Lying in bed at night. You should do 40-50 contractions per day. Do not perform more Kegel exercises per day than recommended. Overexercising can cause muscle fatigue. Continue these exercises for for at least 15-20 weeks or as directed by your caregiver.   This information is not intended to replace advice  given to you by your health care provider. Make sure you discuss any questions you have with your health care provider.   Document Released: 01/12/2012 Document Revised: 02/15/2014 Document Reviewed: 01/12/2012 Elsevier Interactive Patient Education 2016 [employer] Inc.  High-Fiber Diet Fiber, also called dietary fiber, is a type of carbohydrate found in fruits, vegetables, whole grains, and beans. A high-fiber diet can have many health benefits. Your health care provider may recommend a high-fiber diet to help:  Prevent constipation. Fiber can make your bowel movements more regular.  Lower your cholesterol.  Relieve hemorrhoids, uncomplicated diverticulosis, or irritable bowel syndrome.  Prevent overeating as part of a weight-loss plan.  Prevent heart disease, type 2 diabetes, and certain cancers. WHAT IS MY PLAN? The recommended daily intake of fiber includes:  38 grams for men under age 78.  66 grams for men over age 15.  103 grams for women under age 70.  58 grams for women over age 26. You can get the recommended daily intake of dietary fiber by eating a variety of fruits, vegetables, grains, and beans. Your health care provider may also recommend a fiber supplement if it is not possible to get enough fiber through your diet. WHAT DO I NEED TO KNOW ABOUT A HIGH-FIBER DIET?  Fiber supplements have not been widely studied for their effectiveness, so it is better to get fiber through food sources.  Always check the fiber content on thenutrition facts label of any prepackaged food. Look for foods that contain at least 5 grams of  fiber per serving.  Ask your dietitian if you have questions about specific foods that are related to your condition, especially if those foods are not listed in the following section.  Increase your daily fiber consumption gradually. Increasing your intake of dietary fiber too quickly may cause bloating, cramping, or gas.  Drink plenty of water. Water  helps you to digest fiber. WHAT FOODS CAN I EAT? Grains Whole-grain breads. Multigrain cereal. Oats and oatmeal. Brown rice. Barley. Bulgur wheat. [place]. Bran muffins. Popcorn. Rye wafer crackers. Vegetables Sweet potatoes. Spinach. Kale. Artichokes. Cabbage. Broccoli. Green peas. Carrots. Squash. Fruits Berries. Pears. Apples. Oranges. Avocados. Prunes and raisins. Dried figs. Meats and Other Protein Sources [place], kidney, pinto, and soy beans. Split peas. Lentils. Nuts and seeds. Dairy Fiber-fortified yogurt. Beverages Fiber-fortified soy milk. Fiber-fortified orange juice. Other Fiber bars. The items listed above may not be a complete list of recommended foods or beverages. Contact your dietitian for more options. WHAT FOODS ARE NOT RECOMMENDED? Grains White bread. Pasta made with refined flour. White rice. Vegetables Fried potatoes. Canned vegetables. Well-cooked vegetables.  Fruits Fruit juice. Cooked, strained fruit. Meats and Other Protein Sources Fatty cuts of meat. Fried Sales executive or fried fish. Dairy Milk. Yogurt. Cream cheese. Sour cream. Beverages Soft drinks. Other Cakes and pastries. Butter and oils. The items listed above may not be a complete list of foods and beverages to avoid. Contact your dietitian for more information. WHAT ARE SOME TIPS FOR INCLUDING HIGH-FIBER FOODS IN MY DIET?  Eat a wide variety of high-fiber foods.  Make sure that half of all grains consumed each day are whole grains.  Replace breads and cereals made from refined flour or white flour with whole-grain breads and cereals.  Replace white rice with brown rice, bulgur wheat, or millet.  Start the day with a breakfast that is high in fiber, such as a cereal that contains at least 5 grams of fiber per serving.  Use beans in place of meat in soups, salads, or pasta.  Eat high-fiber snacks, such as berries, raw vegetables, nuts, or popcorn.   This information is not intended to replace  advice given to you by your health care provider. Make sure you discuss any questions you have with your health care provider.   Document Released: 01/25/2005 Document Revised: 02/15/2014 Document Reviewed: 07/10/2013 Elsevier Interactive Patient Education 2016 [place] you for choosing me and [facility].  Pricilla Riffle. Dagoberto Ligas., MD., Marval Regal

## 2016-04-05 DIAGNOSIS — E559 Vitamin D deficiency, unspecified: Secondary | ICD-10-CM | POA: Diagnosis not present

## 2016-04-05 DIAGNOSIS — E538 Deficiency of other specified B group vitamins: Secondary | ICD-10-CM | POA: Diagnosis not present

## 2016-04-05 DIAGNOSIS — I10 Essential (primary) hypertension: Secondary | ICD-10-CM | POA: Diagnosis not present

## 2016-04-05 DIAGNOSIS — I208 Other forms of angina pectoris: Secondary | ICD-10-CM | POA: Diagnosis not present

## 2016-04-05 DIAGNOSIS — E785 Hyperlipidemia, unspecified: Secondary | ICD-10-CM | POA: Diagnosis not present

## 2016-04-13 DIAGNOSIS — M20091 Other deformity of right finger(s): Secondary | ICD-10-CM | POA: Diagnosis not present

## 2016-04-13 DIAGNOSIS — M20092 Other deformity of left finger(s): Secondary | ICD-10-CM | POA: Diagnosis not present

## 2016-04-16 ENCOUNTER — Ambulatory Visit
Admission: RE | Admit: 2016-04-16 | Discharge: 2016-04-16 | Disposition: A | Discharge status: Home / Self Care | Payer: Medicare HMO | Source: Ambulatory Visit | Attending: Family Medicine | Admitting: Family Medicine

## 2016-04-16 ENCOUNTER — Other Ambulatory Visit: Payer: Self-pay | Admitting: Family Medicine

## 2016-04-16 DIAGNOSIS — M19041 Primary osteoarthritis, right hand: Secondary | ICD-10-CM | POA: Diagnosis not present

## 2016-04-16 DIAGNOSIS — M20092 Other deformity of left finger(s): Secondary | ICD-10-CM

## 2016-05-03 DIAGNOSIS — H903 Sensorineural hearing loss, bilateral: Secondary | ICD-10-CM | POA: Diagnosis not present

## 2016-05-04 DIAGNOSIS — Z961 Presence of intraocular lens: Secondary | ICD-10-CM | POA: Diagnosis not present

## 2016-05-04 DIAGNOSIS — H524 Presbyopia: Secondary | ICD-10-CM | POA: Diagnosis not present

## 2016-05-04 DIAGNOSIS — H5203 Hypermetropia, bilateral: Secondary | ICD-10-CM | POA: Diagnosis not present

## 2016-05-04 DIAGNOSIS — H26493 Other secondary cataract, bilateral: Secondary | ICD-10-CM | POA: Diagnosis not present

## 2016-05-04 DIAGNOSIS — H52223 Regular astigmatism, bilateral: Secondary | ICD-10-CM | POA: Diagnosis not present

## 2016-05-28 DIAGNOSIS — M25562 Pain in left knee: Secondary | ICD-10-CM | POA: Diagnosis not present

## 2016-05-28 DIAGNOSIS — M25561 Pain in right knee: Secondary | ICD-10-CM | POA: Diagnosis not present

## 2016-05-28 DIAGNOSIS — G8929 Other chronic pain: Secondary | ICD-10-CM | POA: Diagnosis not present

## 2016-06-23 ENCOUNTER — Encounter: Payer: Self-pay | Admitting: Gynecology

## 2016-06-28 DIAGNOSIS — Z1231 Encounter for screening mammogram for malignant neoplasm of breast: Secondary | ICD-10-CM | POA: Diagnosis not present

## 2016-06-30 ENCOUNTER — Encounter: Payer: Self-pay | Admitting: Anesthesiology

## 2016-07-06 ENCOUNTER — Encounter: Payer: Self-pay | Admitting: Gynecology

## 2016-07-06 DIAGNOSIS — I10 Essential (primary) hypertension: Secondary | ICD-10-CM | POA: Diagnosis not present

## 2016-07-06 DIAGNOSIS — E538 Deficiency of other specified B group vitamins: Secondary | ICD-10-CM | POA: Diagnosis not present

## 2016-07-06 DIAGNOSIS — I208 Other forms of angina pectoris: Secondary | ICD-10-CM | POA: Diagnosis not present

## 2016-07-06 DIAGNOSIS — E559 Vitamin D deficiency, unspecified: Secondary | ICD-10-CM | POA: Diagnosis not present

## 2016-07-06 DIAGNOSIS — N6011 Diffuse cystic mastopathy of right breast: Secondary | ICD-10-CM | POA: Diagnosis not present

## 2016-07-06 DIAGNOSIS — E785 Hyperlipidemia, unspecified: Secondary | ICD-10-CM | POA: Diagnosis not present

## 2016-09-24 DIAGNOSIS — B353 Tinea pedis: Secondary | ICD-10-CM | POA: Diagnosis not present

## 2016-09-24 DIAGNOSIS — L308 Other specified dermatitis: Secondary | ICD-10-CM | POA: Diagnosis not present

## 2016-10-04 DIAGNOSIS — I208 Other forms of angina pectoris: Secondary | ICD-10-CM | POA: Diagnosis not present

## 2016-10-04 DIAGNOSIS — I471 Supraventricular tachycardia: Secondary | ICD-10-CM | POA: Diagnosis not present

## 2016-10-04 DIAGNOSIS — R7309 Other abnormal glucose: Secondary | ICD-10-CM | POA: Diagnosis not present

## 2016-10-04 DIAGNOSIS — E538 Deficiency of other specified B group vitamins: Secondary | ICD-10-CM | POA: Diagnosis not present

## 2016-10-04 DIAGNOSIS — I10 Essential (primary) hypertension: Secondary | ICD-10-CM | POA: Diagnosis not present

## 2016-10-04 DIAGNOSIS — E785 Hyperlipidemia, unspecified: Secondary | ICD-10-CM | POA: Diagnosis not present

## 2016-10-04 DIAGNOSIS — E559 Vitamin D deficiency, unspecified: Secondary | ICD-10-CM | POA: Diagnosis not present

## 2016-12-13 DIAGNOSIS — I10 Essential (primary) hypertension: Secondary | ICD-10-CM | POA: Diagnosis not present

## 2016-12-13 DIAGNOSIS — E559 Vitamin D deficiency, unspecified: Secondary | ICD-10-CM | POA: Diagnosis not present

## 2016-12-13 DIAGNOSIS — I208 Other forms of angina pectoris: Secondary | ICD-10-CM | POA: Diagnosis not present

## 2016-12-13 DIAGNOSIS — E538 Deficiency of other specified B group vitamins: Secondary | ICD-10-CM | POA: Diagnosis not present

## 2016-12-13 DIAGNOSIS — E785 Hyperlipidemia, unspecified: Secondary | ICD-10-CM | POA: Diagnosis not present

## 2016-12-14 DIAGNOSIS — R42 Dizziness and giddiness: Secondary | ICD-10-CM | POA: Diagnosis not present

## 2016-12-14 DIAGNOSIS — Z23 Encounter for immunization: Secondary | ICD-10-CM | POA: Diagnosis not present

## 2017-01-04 DIAGNOSIS — M25562 Pain in left knee: Secondary | ICD-10-CM | POA: Diagnosis not present

## 2017-01-04 DIAGNOSIS — M1711 Unilateral primary osteoarthritis, right knee: Secondary | ICD-10-CM | POA: Diagnosis not present

## 2017-01-04 DIAGNOSIS — M25561 Pain in right knee: Secondary | ICD-10-CM | POA: Diagnosis not present

## 2017-01-04 DIAGNOSIS — G8929 Other chronic pain: Secondary | ICD-10-CM | POA: Diagnosis not present

## 2017-02-21 DIAGNOSIS — J069 Acute upper respiratory infection, unspecified: Secondary | ICD-10-CM | POA: Diagnosis not present

## 2017-02-21 DIAGNOSIS — R197 Diarrhea, unspecified: Secondary | ICD-10-CM | POA: Diagnosis not present

## 2017-03-08 DIAGNOSIS — R69 Illness, unspecified: Secondary | ICD-10-CM | POA: Diagnosis not present

## 2017-03-09 DIAGNOSIS — H26493 Other secondary cataract, bilateral: Secondary | ICD-10-CM | POA: Diagnosis not present

## 2017-03-09 DIAGNOSIS — Z961 Presence of intraocular lens: Secondary | ICD-10-CM | POA: Diagnosis not present

## 2017-03-14 DIAGNOSIS — E559 Vitamin D deficiency, unspecified: Secondary | ICD-10-CM | POA: Diagnosis not present

## 2017-03-14 DIAGNOSIS — I208 Other forms of angina pectoris: Secondary | ICD-10-CM | POA: Diagnosis not present

## 2017-03-14 DIAGNOSIS — E785 Hyperlipidemia, unspecified: Secondary | ICD-10-CM | POA: Diagnosis not present

## 2017-03-14 DIAGNOSIS — E538 Deficiency of other specified B group vitamins: Secondary | ICD-10-CM | POA: Diagnosis not present

## 2017-03-14 DIAGNOSIS — I10 Essential (primary) hypertension: Secondary | ICD-10-CM | POA: Diagnosis not present

## 2017-03-22 DIAGNOSIS — J0110 Acute frontal sinusitis, unspecified: Secondary | ICD-10-CM | POA: Diagnosis not present

## 2017-03-22 DIAGNOSIS — R05 Cough: Secondary | ICD-10-CM | POA: Diagnosis not present

## 2017-03-22 DIAGNOSIS — R51 Headache: Secondary | ICD-10-CM | POA: Diagnosis not present

## 2017-04-29 DIAGNOSIS — H26491 Other secondary cataract, right eye: Secondary | ICD-10-CM | POA: Diagnosis not present

## 2017-06-03 DIAGNOSIS — H26492 Other secondary cataract, left eye: Secondary | ICD-10-CM | POA: Diagnosis not present

## 2017-06-07 DIAGNOSIS — Z823 Family history of stroke: Secondary | ICD-10-CM | POA: Diagnosis not present

## 2017-06-07 DIAGNOSIS — G8929 Other chronic pain: Secondary | ICD-10-CM | POA: Diagnosis not present

## 2017-06-07 DIAGNOSIS — E785 Hyperlipidemia, unspecified: Secondary | ICD-10-CM | POA: Diagnosis not present

## 2017-06-07 DIAGNOSIS — Z6830 Body mass index (BMI) 30.0-30.9, adult: Secondary | ICD-10-CM | POA: Diagnosis not present

## 2017-06-07 DIAGNOSIS — E669 Obesity, unspecified: Secondary | ICD-10-CM | POA: Diagnosis not present

## 2017-06-07 DIAGNOSIS — I4891 Unspecified atrial fibrillation: Secondary | ICD-10-CM | POA: Diagnosis not present

## 2017-06-07 DIAGNOSIS — I10 Essential (primary) hypertension: Secondary | ICD-10-CM | POA: Diagnosis not present

## 2017-06-07 DIAGNOSIS — Z809 Family history of malignant neoplasm, unspecified: Secondary | ICD-10-CM | POA: Diagnosis not present

## 2017-06-07 DIAGNOSIS — G4730 Sleep apnea, unspecified: Secondary | ICD-10-CM | POA: Diagnosis not present

## 2017-06-07 DIAGNOSIS — Z7982 Long term (current) use of aspirin: Secondary | ICD-10-CM | POA: Diagnosis not present

## 2017-06-13 DIAGNOSIS — Z0000 Encounter for general adult medical examination without abnormal findings: Secondary | ICD-10-CM | POA: Diagnosis not present

## 2017-06-13 DIAGNOSIS — I10 Essential (primary) hypertension: Secondary | ICD-10-CM | POA: Diagnosis not present

## 2017-06-13 DIAGNOSIS — I208 Other forms of angina pectoris: Secondary | ICD-10-CM | POA: Diagnosis not present

## 2017-06-13 DIAGNOSIS — E119 Type 2 diabetes mellitus without complications: Secondary | ICD-10-CM | POA: Diagnosis not present

## 2017-06-13 DIAGNOSIS — E559 Vitamin D deficiency, unspecified: Secondary | ICD-10-CM | POA: Diagnosis not present

## 2017-06-13 DIAGNOSIS — Z23 Encounter for immunization: Secondary | ICD-10-CM | POA: Diagnosis not present

## 2017-06-13 DIAGNOSIS — Z6830 Body mass index (BMI) 30.0-30.9, adult: Secondary | ICD-10-CM | POA: Diagnosis not present

## 2017-06-13 DIAGNOSIS — E785 Hyperlipidemia, unspecified: Secondary | ICD-10-CM | POA: Diagnosis not present

## 2017-08-01 DIAGNOSIS — M1711 Unilateral primary osteoarthritis, right knee: Secondary | ICD-10-CM | POA: Diagnosis not present

## 2017-08-01 DIAGNOSIS — M25561 Pain in right knee: Secondary | ICD-10-CM | POA: Diagnosis not present

## 2017-08-01 DIAGNOSIS — M170 Bilateral primary osteoarthritis of knee: Secondary | ICD-10-CM | POA: Diagnosis not present

## 2017-08-08 DIAGNOSIS — M8580 Other specified disorders of bone density and structure, unspecified site: Secondary | ICD-10-CM

## 2017-08-08 HISTORY — DX: Other specified disorders of bone density and structure, unspecified site: M85.80

## 2017-08-22 DIAGNOSIS — J069 Acute upper respiratory infection, unspecified: Secondary | ICD-10-CM | POA: Diagnosis not present

## 2017-08-26 DIAGNOSIS — Z1231 Encounter for screening mammogram for malignant neoplasm of breast: Secondary | ICD-10-CM | POA: Diagnosis not present

## 2017-08-26 DIAGNOSIS — M8589 Other specified disorders of bone density and structure, multiple sites: Secondary | ICD-10-CM | POA: Diagnosis not present

## 2017-08-26 DIAGNOSIS — Z780 Asymptomatic menopausal state: Secondary | ICD-10-CM | POA: Diagnosis not present

## 2017-08-29 ENCOUNTER — Encounter: Payer: Self-pay | Admitting: Gynecology

## 2017-08-30 ENCOUNTER — Encounter: Payer: Self-pay | Admitting: Gynecology

## 2017-09-12 DIAGNOSIS — R609 Edema, unspecified: Secondary | ICD-10-CM | POA: Diagnosis not present

## 2017-09-12 DIAGNOSIS — E559 Vitamin D deficiency, unspecified: Secondary | ICD-10-CM | POA: Diagnosis not present

## 2017-09-12 DIAGNOSIS — I10 Essential (primary) hypertension: Secondary | ICD-10-CM | POA: Diagnosis not present

## 2017-09-12 DIAGNOSIS — E785 Hyperlipidemia, unspecified: Secondary | ICD-10-CM | POA: Diagnosis not present

## 2017-09-12 DIAGNOSIS — I208 Other forms of angina pectoris: Secondary | ICD-10-CM | POA: Diagnosis not present

## 2017-09-13 DIAGNOSIS — M1711 Unilateral primary osteoarthritis, right knee: Secondary | ICD-10-CM | POA: Diagnosis not present

## 2017-09-20 DIAGNOSIS — M1711 Unilateral primary osteoarthritis, right knee: Secondary | ICD-10-CM | POA: Diagnosis not present

## 2017-09-27 DIAGNOSIS — M1711 Unilateral primary osteoarthritis, right knee: Secondary | ICD-10-CM | POA: Diagnosis not present

## 2017-10-04 DIAGNOSIS — R69 Illness, unspecified: Secondary | ICD-10-CM | POA: Diagnosis not present

## 2017-10-07 DIAGNOSIS — R69 Illness, unspecified: Secondary | ICD-10-CM | POA: Diagnosis not present

## 2017-10-07 DIAGNOSIS — I639 Cerebral infarction, unspecified: Secondary | ICD-10-CM | POA: Diagnosis not present

## 2017-10-07 DIAGNOSIS — N501 Vascular disorders of male genital organs: Secondary | ICD-10-CM | POA: Diagnosis not present

## 2017-10-18 DIAGNOSIS — M170 Bilateral primary osteoarthritis of knee: Secondary | ICD-10-CM | POA: Diagnosis not present

## 2017-10-18 DIAGNOSIS — M25562 Pain in left knee: Secondary | ICD-10-CM | POA: Diagnosis not present

## 2017-11-18 DIAGNOSIS — Z23 Encounter for immunization: Secondary | ICD-10-CM | POA: Diagnosis not present

## 2017-11-18 DIAGNOSIS — M8588 Other specified disorders of bone density and structure, other site: Secondary | ICD-10-CM | POA: Diagnosis not present

## 2017-11-18 DIAGNOSIS — R350 Frequency of micturition: Secondary | ICD-10-CM | POA: Diagnosis not present

## 2017-11-18 DIAGNOSIS — R69 Illness, unspecified: Secondary | ICD-10-CM | POA: Diagnosis not present

## 2017-11-23 ENCOUNTER — Emergency Department (HOSPITAL_COMMUNITY)
Admission: EM | Admit: 2017-11-23 | Discharge: 2017-11-23 | Disposition: A | Discharge status: Home / Self Care | Payer: Medicare HMO | Attending: Emergency Medicine | Admitting: Emergency Medicine

## 2017-11-23 ENCOUNTER — Other Ambulatory Visit: Payer: Self-pay

## 2017-11-23 ENCOUNTER — Encounter (HOSPITAL_COMMUNITY): Payer: Self-pay | Admitting: Emergency Medicine

## 2017-11-23 DIAGNOSIS — R197 Diarrhea, unspecified: Secondary | ICD-10-CM | POA: Diagnosis not present

## 2017-11-23 DIAGNOSIS — Z7982 Long term (current) use of aspirin: Secondary | ICD-10-CM | POA: Insufficient documentation

## 2017-11-23 DIAGNOSIS — R112 Nausea with vomiting, unspecified: Secondary | ICD-10-CM | POA: Diagnosis not present

## 2017-11-23 DIAGNOSIS — I10 Essential (primary) hypertension: Secondary | ICD-10-CM | POA: Insufficient documentation

## 2017-11-23 DIAGNOSIS — Z79899 Other long term (current) drug therapy: Secondary | ICD-10-CM | POA: Diagnosis not present

## 2017-11-23 LAB — COMPREHENSIVE METABOLIC PANEL
ALK PHOS: 50 U/L (ref 38–126)
ALT: 16 U/L (ref 0–44)
AST: 16 U/L (ref 15–41)
Albumin: 3.6 g/dL (ref 3.5–5.0)
Anion gap: 8 (ref 5–15)
BILIRUBIN TOTAL: 0.4 mg/dL (ref 0.3–1.2)
BUN: 11 mg/dL (ref 8–23)
CHLORIDE: 110 mmol/L (ref 98–111)
CO2: 23 mmol/L (ref 22–32)
CREATININE: 1.07 mg/dL — AB (ref 0.44–1.00)
Calcium: 8.5 mg/dL — ABNORMAL LOW (ref 8.9–10.3)
GFR calc Af Amer: 57 mL/min — ABNORMAL LOW (ref >60)
GFR, EST NON AFRICAN AMERICAN: 49 mL/min — AB (ref >60)
Glucose, Bld: 100 mg/dL — ABNORMAL HIGH (ref 70–99)
POTASSIUM: 3.5 mmol/L (ref 3.5–5.1)
Sodium: 141 mmol/L (ref 135–145)
Total Protein: 6.5 g/dL (ref 6.5–8.1)

## 2017-11-23 LAB — CBC WITH DIFFERENTIAL/PLATELET
ABS IMMATURE GRANULOCYTES: 0.01 10*3/uL (ref 0.00–0.07)
Basophils Absolute: 0 10*3/uL (ref 0.0–0.1)
Basophils Relative: 0 %
Eosinophils Absolute: 0 10*3/uL (ref 0.0–0.5)
Eosinophils Relative: 0 %
HEMATOCRIT: 40.9 % (ref 36.0–46.0)
Hemoglobin: 12.8 g/dL (ref 12.0–15.0)
IMMATURE GRANULOCYTES: 0 %
LYMPHS ABS: 0.4 10*3/uL — AB (ref 0.7–4.0)
Lymphocytes Relative: 8 %
MCH: 29.9 pg (ref 26.0–34.0)
MCHC: 31.3 g/dL (ref 30.0–36.0)
MCV: 95.6 fL (ref 80.0–100.0)
Monocytes Absolute: 0.4 10*3/uL (ref 0.1–1.0)
Monocytes Relative: 7 %
NEUTROS ABS: 4.7 10*3/uL (ref 1.7–7.7)
NEUTROS PCT: 85 %
Platelets: 196 10*3/uL (ref 150–400)
RBC: 4.28 MIL/uL (ref 3.87–5.11)
RDW: 14 % (ref 11.5–15.5)
WBC: 5.5 10*3/uL (ref 4.0–10.5)
nRBC: 0 % (ref 0.0–0.2)

## 2017-11-23 LAB — C DIFFICILE QUICK SCREEN W PCR REFLEX
C DIFFICILE (CDIFF) INTERP: NOT DETECTED
C DIFFICILE (CDIFF) TOXIN: NEGATIVE
C DIFFICLE (CDIFF) ANTIGEN: NEGATIVE

## 2017-11-23 LAB — LIPASE, BLOOD: LIPASE: 25 U/L (ref 11–51)

## 2017-11-23 MED ORDER — ONDANSETRON HCL 4 MG/2ML IJ SOLN: 4.0000 mg | Freq: Once | INTRAMUSCULAR | Status: DC

## 2017-11-23 MED ORDER — ONDANSETRON 4 MG PO TBDP: 4.0000 mg | ORAL_TABLET | Freq: Three times a day (TID) | ORAL | 0 refills | Status: DC | PRN

## 2017-11-23 MED ORDER — SODIUM CHLORIDE 0.9 % IV BOLUS
1000.0000 mL | Freq: Once | INTRAVENOUS | Status: AC
  Administered 2017-11-23: 1000 mL via INTRAVENOUS

## 2017-11-23 NOTE — ED Provider Notes (Signed)
[facility] DEPT Provider Note   CSN: [id number] Arrival date & time: 11/23/17  1233     History   Chief Complaint Chief Complaint  Patient presents with  . Diarrhea  . Emesis    HPI Regina [name] is a 77 y.o. female.  The history is provided by the patient and medical records. No language interpreter was used.  Diarrhea   Associated symptoms include vomiting. Pertinent negatives include no abdominal pain.  Emesis   Associated symptoms include diarrhea. Pertinent negatives include no abdominal pain.   Regina [name] is a 77 y.o. female  with a PMH of prior appendectomy and hysterectomy who presents to the Emergency Department complaining of nausea vomiting and nonbloody diarrhea for the last 2 days.  She has had 2 episodes of loose stool today, however no vomiting.  Denies abdominal pain. No medications taken prior to arrival for symptoms.  She had heard that mustard may help, so she tried to eat some of this.  This did not provide any relief.  She denies any fever, chest pain, shortness of breath, back pain or urinary symptoms.  No recent travel or suspicious food intakes.  She was on amoxicillin after a dental procedure approximately 1 month ago.    Past Medical History:  Diagnosis Date  . Apnea, sleep    cpap use irregulary  . Arthritis   . Dysrhythmia    hx svt 04/ ablation no problms since per patient  . Elevated cholesterol   . Fibroid   . Hypertension   . IC (interstitial cystitis)   . Osteopenia 08/2017   T score -1.9 FRAX 4% / 0.8%  . PAD (peripheral artery disease) ([place])   . Pneumonia    hx  . Sleep apnea     Patient Active Problem List   Diagnosis Date Noted  . Family history of ovarian cancer 05/23/2013  . OAB (overactive bladder) 05/23/2013  . Vaginal atrophy 03/06/2013  . Papilloma of right breast 09/25/2012  . Hypertension   . Elevated cholesterol   . IC (interstitial cystitis)   . Fibroid   . Apnea, sleep    . PAD (peripheral artery disease) ([place])   . HYPERCHOLESTEROLEMIA 11/10/2009  . PROPTOSIS 11/10/2009  . SUPRAVENTRICULAR TACHYCARDIA 11/10/2009  . ACUTE BRONCHITIS 11/10/2009  . DIVERTICULOSIS OF COLON 11/10/2009  . RENAL CYST 11/10/2009  . INTERSTITIAL CYSTITIS 11/10/2009  . BACK PAIN, LUMBAR 11/10/2009  . COUGH 11/10/2009  . HYPERTENSION 04/04/2007  . DEGENERATIVE JOINT DISEASE 04/04/2007    Past Surgical History:  Procedure Laterality Date  . ABDOMINAL HYSTERECTOMY     TAH LSO  . APPENDECTOMY    . BREAST LUMPECTOMY WITH NEEDLE LOCALIZATION Right 11/22/2012   Procedure: BREAST LUMPECTOMY WITH NEEDLE LOCALIZATION;  [place]: Harl Bowie, MD;  Location: [facility];  Service: General;  Laterality: Right;  . BREAST SURGERY     Lumpectomy-Benign  . BREAST SURGERY     Biopsy  . CARDIAC ELECTROPHYSIOLOGY STUDY AND ABLATION    . EYE SURGERY Bilateral    cat  . HEMORRHOID SURGERY    . OOPHORECTOMY     LSO  . TONSILLECTOMY       OB History    Gravida  2   Para  2   Term  2   Preterm      AB      Living  2     SAB      TAB      Ectopic  Multiple      Live Births               Home Medications    Prior to Admission medications   Medication Sig Start Date End Date Taking? Authorizing Provider  alendronate (FOSAMAX) 70 MG tablet Take 70 mg by mouth once a week. On an empty stomach 11/18/17  Yes [provider]  aspirin 81 MG tablet Take 81 mg by mouth daily.   Yes [provider]  atorvastatin (LIPITOR) 10 MG tablet Take 10 mg by mouth daily.   Yes [provider]  CALCIUM PO Take 1 tablet by mouth 2 (two) times daily.   Yes [provider]  losartan (COZAAR) 50 MG tablet Take 50 mg by mouth daily. 11/02/17  Yes [provider]  metoprolol tartrate (LOPRESSOR) 25 MG tablet Take 25 mg by mouth 2 (two) times daily.   Yes [provider]  Multiple Vitamin (MULTIVITAMIN) LIQD Take 5 mLs by mouth  daily.   Yes [provider]  Vitamin D, Ergocalciferol, (DRISDOL) 50000 units CAPS capsule Take 50,000 Units by mouth every 7 (seven) days.   Yes [provider]  ondansetron (ZOFRAN ODT) 4 MG disintegrating tablet Take 1 tablet (4 mg total) by mouth every 8 (eight) hours as needed for nausea or vomiting. 11/23/17   Ward, Ozella Almond, PA-C  ATENOLOL PO Take by mouth.  04/19/11  [provider]    Family History Family History  Problem Relation Age of Onset  . Diabetes Mother   . Hypertension Mother   . Stroke Mother   . Cancer Father        Multiple Myloma  . Cancer Daughter 5       OVARIAN CANCER STAGE 3   . Colon cancer Neg Hx     Social History Social History   Tobacco Use  . Smoking status: Never Smoker  . Smokeless tobacco: Never Used  Substance Use Topics  . Alcohol use: No  . Drug use: No     Allergies   Other   Review of Systems Review of Systems  Gastrointestinal: Positive for diarrhea, nausea and vomiting. Negative for abdominal pain.  All other systems reviewed and are negative.    Physical Exam Updated Vital Signs BP (!) 149/78   Pulse 69   Temp 98.5 F (36.9 C)   Resp 16   SpO2 100%   Physical Exam  Constitutional: She is oriented to person, place, and time. She appears well-developed and well-nourished. No distress.  Well-appearing.  HENT:  Head: Normocephalic and atraumatic.  Neck: Neck supple.  Cardiovascular: Normal rate, regular rhythm and normal heart sounds.  No murmur heard. Pulmonary/Chest: Effort normal and breath sounds normal. No respiratory distress.  Abdominal: Soft. Bowel sounds are normal. She exhibits no distension.  No abdominal tenderness.  Neurological: She is alert and oriented to person, place, and time.  Skin: Skin is warm and dry.  Nursing note and vitals reviewed.    ED Treatments / Results  Labs (all labs ordered are listed, but only abnormal results are displayed) Labs Reviewed   CBC WITH DIFFERENTIAL/PLATELET - Abnormal; Notable for the following components:      Result Value   Lymphs Abs 0.4 (*)    All other components within normal limits  COMPREHENSIVE METABOLIC PANEL - Abnormal; Notable for the following components:   Glucose, Bld 100 (*)    Creatinine, Ser 1.07 (*)    Calcium 8.5 (*)  GFR calc non Af Amer 49 (*)    GFR calc Af Amer 57 (*)    All other components within normal limits  C DIFFICILE QUICK SCREEN W PCR REFLEX  GASTROINTESTINAL PANEL BY PCR, STOOL (REPLACES STOOL CULTURE)  LIPASE, BLOOD  URINALYSIS, ROUTINE W REFLEX MICROSCOPIC    EKG None  Radiology No results found.  Procedures Procedures (including critical care time)  Medications Ordered in ED Medications  ondansetron (ZOFRAN) injection 4 mg (has no administration in time range)  sodium chloride 0.9 % bolus 1,000 mL (1,000 mLs Intravenous New Bag/Given 11/23/17 1452)     Initial Impression / Assessment and Plan / ED Course  I have reviewed the triage vital signs and the nursing notes.  Pertinent labs & imaging results that were available during my care of the patient were reviewed by me and considered in my medical decision making (see chart for details).    Regina [name] is a 77 y.o. female who presents to ED for n/v/d x 2 days. Actually has had no emesis today, but has had multiple loose, non-bloody stools. She is very well appearing. Afebrile, VSS with no abdominal tenderness. Labs reviewed and reassuring. No white count. [place] normal. Patient re-evaluated multiple times and continues to have no abdominal tenderness on serial examinations. Shared decision making concerning whether or not to obtain CT scan given well-appearing, no abdominal pain, no tenderness, no white count / fever and improved symptoms. She has family in the room who agree to stay with her. Started on 1L fluids given. At shift change, patient just began receiving her fluid bolus. Care assumed by  oncoming provider PA [facility]. Will re-evaluate following fluid bolus, PO challenge and repeat abdominal exam. If she tolerates po and has benign abdominal exam, likely can discharge home with close PCP follow up. I have gone over return precautions with patient and her family at bedside.  They expressed understanding and agreement of plan as documented above.  Patient seen by and discussed with Dr. Sedonia Small who agrees with treatment plan.    Final Clinical Impressions(s) / ED Diagnoses   Final diagnoses:  Nausea vomiting and diarrhea    ED Discharge Orders         Ordered    ondansetron (ZOFRAN ODT) 4 MG disintegrating tablet  Every 8 hours PRN     11/23/17 1544           Ward, Ozella Almond, PA-C 11/23/17 1552    Maudie Flakes, MD 11/23/17 [phone]

## 2017-11-23 NOTE — ED Provider Notes (Signed)
Physical Exam  BP (!) 147/77   Pulse 62   Temp 98.5 F (36.9 C)   Resp 16   SpO2 100%   Assumed care from [facility], PA-C at 1600. Briefly, the patient is a 77 y.o. female with PMHx of  has a past medical history of Apnea, sleep, Arthritis, Dysrhythmia, Elevated cholesterol, Fibroid, Hypertension, IC (interstitial cystitis), Osteopenia (08/2017), PAD (peripheral artery disease) ([place]), Pneumonia, and Sleep apnea. here with nausea, vomiting, and diarrhea.  Patient has had watery stools over the past 2 days.  Abdominal surgical history significant for appendectomy and hysterectomy.  Patient has no emesis in the emergency department, but has had 3 episodes of diarrhea.  Patient denies hematochezia or melena.  On my evaluation, resting comfortably, and continues to deny abdominal pain.  Labs Reviewed  CBC WITH DIFFERENTIAL/PLATELET - Abnormal; Notable for the following components:      Result Value   Lymphs Abs 0.4 (*)    All other components within normal limits  COMPREHENSIVE METABOLIC PANEL - Abnormal; Notable for the following components:   Glucose, Bld 100 (*)    Creatinine, Ser 1.07 (*)    Calcium 8.5 (*)    GFR calc non Af Amer 49 (*)    GFR calc Af Amer 57 (*)    All other components within normal limits  C DIFFICILE QUICK SCREEN W PCR REFLEX  GASTROINTESTINAL PANEL BY PCR, STOOL (REPLACES STOOL CULTURE)  LIPASE, BLOOD  URINALYSIS, ROUTINE W REFLEX MICROSCOPIC    Course of Care:   Physical Exam  Constitutional: She appears well-developed and well-nourished. No distress.  Sitting comfortably in bed.  HENT:  Head: Normocephalic and atraumatic.  Eyes: Conjunctivae are normal. Right eye exhibits no discharge. Left eye exhibits no discharge.  EOMs normal to gross examination.  Neck: Normal range of motion.  Cardiovascular: Normal rate and regular rhythm.  Intact, 2+ radial pulse.  Pulmonary/Chest:  Normal respiratory effort. Patient converses comfortably. No audible  wheeze or stridor.  Abdominal: Soft. Bowel sounds are normal. She exhibits no distension. There is no tenderness. There is no guarding.  Musculoskeletal: Normal range of motion.  Neurological: She is alert.  Cranial nerves intact to gross observation. Patient moves extremities without difficulty.  Skin: Skin is warm and dry. She is not diaphoretic.  Psychiatric: She has a normal mood and affect. Her behavior is normal. Judgment and thought content normal.  Nursing note and vitals reviewed.   ED Course/Procedures   Clinical Course as of Nov 24 1938  Wed Nov 23, 2017  1650 Reassessed.  No vomiting.  Continues to have benign abdomen.   [AM]  1931 Reassessed.  Continues to have benign abdomen.  Discussed plan of care including GI panel follow-up with primary care doctor tomorrow, and strict return precautions for any abdominal pain, vomiting, or fever.  Discussed the importance of hydration and hydration strategies.  Patient and her daughter are in understanding and agree with the plan of care.   [AM]    Clinical Course User Index [AM] Albesa Seen, PA-C    Procedures  MDM  Patient is nontoxic-appearing and has nonsurgical abdomen.  No abdominal tenderness.  Patient had repeat abdominal exams by both previous providers, PA Ward and Dr. Sedonia Small.  Patient received 1 L fluid bolus.  GI pathogen panel is pending.  C. difficile negative.  Plan is for patient to follow-up closely with her primary care provider with antiemetics.  Patient is tolerating p.o. and comfortable on my exam.  Patient  had a total of 3-4 episodes of diarrhea in the emergency department, nonbloody.  Patient and daughter are comfortable with plan for discharge and close follow-up with primary care.  Patient had repeated benign abdominal exams and no tenderness or vomiting.    Albesa Seen, PA-C 11/23/17 1941    Quintella Reichert, MD 11/24/17 [phone]

## 2017-11-23 NOTE — Discharge Instructions (Addendum)
Follow up with your primary care doctor to discuss your hospital visit. Continue to hydrate orally with small sips of fluids throughout the day. Use Zofran as directed for nausea & vomiting.   The 'BRAT' diet is suggested, then progress to diet as tolerated as symptoms abate.  Bananas.  Rice.  Applesauce.  Toast (and other simple starches such as crackers, potatoes, noodles).   SEEK IMMEDIATE MEDICAL ATTENTION IF: You begin having localized abdominal pain that does not go away or becomes severe You develop a fever Repeated vomiting occurs (multiple uncontrollable episodes) or you are unable to keep fluids down Blood is being passed in stools or vomit (bright red or black tarry stools).  New or worsening symptoms develop, any additional concerns.

## 2017-11-23 NOTE — ED Triage Notes (Signed)
Patient c/o N/V/D x2 days. Denies abdominal pain.

## 2017-11-23 NOTE — ED Notes (Signed)
Pt denies nausea currently

## 2017-11-24 LAB — GASTROINTESTINAL PANEL BY PCR, STOOL (REPLACES STOOL CULTURE)
ADENOVIRUS F40/41: NOT DETECTED
ASTROVIRUS: NOT DETECTED
CRYPTOSPORIDIUM: NOT DETECTED
CYCLOSPORA CAYETANENSIS: NOT DETECTED
Campylobacter species: NOT DETECTED
ENTAMOEBA HISTOLYTICA: NOT DETECTED
ENTEROPATHOGENIC E COLI (EPEC): NOT DETECTED
ENTEROTOXIGENIC E COLI (ETEC): NOT DETECTED
Enteroaggregative E coli (EAEC): NOT DETECTED
Giardia lamblia: NOT DETECTED
Norovirus GI/GII: NOT DETECTED
Plesimonas shigelloides: NOT DETECTED
Rotavirus A: NOT DETECTED
Salmonella species: NOT DETECTED
Sapovirus (I, II, IV, and V): NOT DETECTED
Shiga like toxin producing E coli (STEC): NOT DETECTED
Shigella/Enteroinvasive E coli (EIEC): NOT DETECTED
VIBRIO CHOLERAE: NOT DETECTED
VIBRIO SPECIES: NOT DETECTED
YERSINIA ENTEROCOLITICA: NOT DETECTED

## 2017-12-19 DIAGNOSIS — M170 Bilateral primary osteoarthritis of knee: Secondary | ICD-10-CM | POA: Diagnosis not present

## 2017-12-19 DIAGNOSIS — M1712 Unilateral primary osteoarthritis, left knee: Secondary | ICD-10-CM | POA: Diagnosis not present

## 2018-01-20 DIAGNOSIS — I208 Other forms of angina pectoris: Secondary | ICD-10-CM | POA: Diagnosis not present

## 2018-01-20 DIAGNOSIS — E785 Hyperlipidemia, unspecified: Secondary | ICD-10-CM | POA: Diagnosis not present

## 2018-01-20 DIAGNOSIS — M1712 Unilateral primary osteoarthritis, left knee: Secondary | ICD-10-CM | POA: Diagnosis not present

## 2018-01-20 DIAGNOSIS — I10 Essential (primary) hypertension: Secondary | ICD-10-CM | POA: Diagnosis not present

## 2018-01-20 DIAGNOSIS — E559 Vitamin D deficiency, unspecified: Secondary | ICD-10-CM | POA: Diagnosis not present

## 2018-01-27 DIAGNOSIS — M1712 Unilateral primary osteoarthritis, left knee: Secondary | ICD-10-CM | POA: Diagnosis not present

## 2018-02-03 DIAGNOSIS — M1712 Unilateral primary osteoarthritis, left knee: Secondary | ICD-10-CM | POA: Diagnosis not present

## 2018-09-20 ENCOUNTER — Other Ambulatory Visit: Payer: Self-pay

## 2018-09-20 DIAGNOSIS — Z20822 Contact with and (suspected) exposure to covid-19: Secondary | ICD-10-CM

## 2018-09-22 LAB — NOVEL CORONAVIRUS, NAA: SARS-CoV-2, NAA: NOT DETECTED

## 2018-11-16 ENCOUNTER — Encounter: Payer: Self-pay | Admitting: Gynecology

## 2019-02-09 IMAGING — CR DG HAND COMPLETE 3+V*L*
3 series · 3 of 3 positions shown · non-contrast
Comparison: None.

CLINICAL DATA: Ulnar deviation of left little finger for several
months.

EXAM:
LEFT HAND - COMPLETE 3+ VIEW

[x hand pa left]
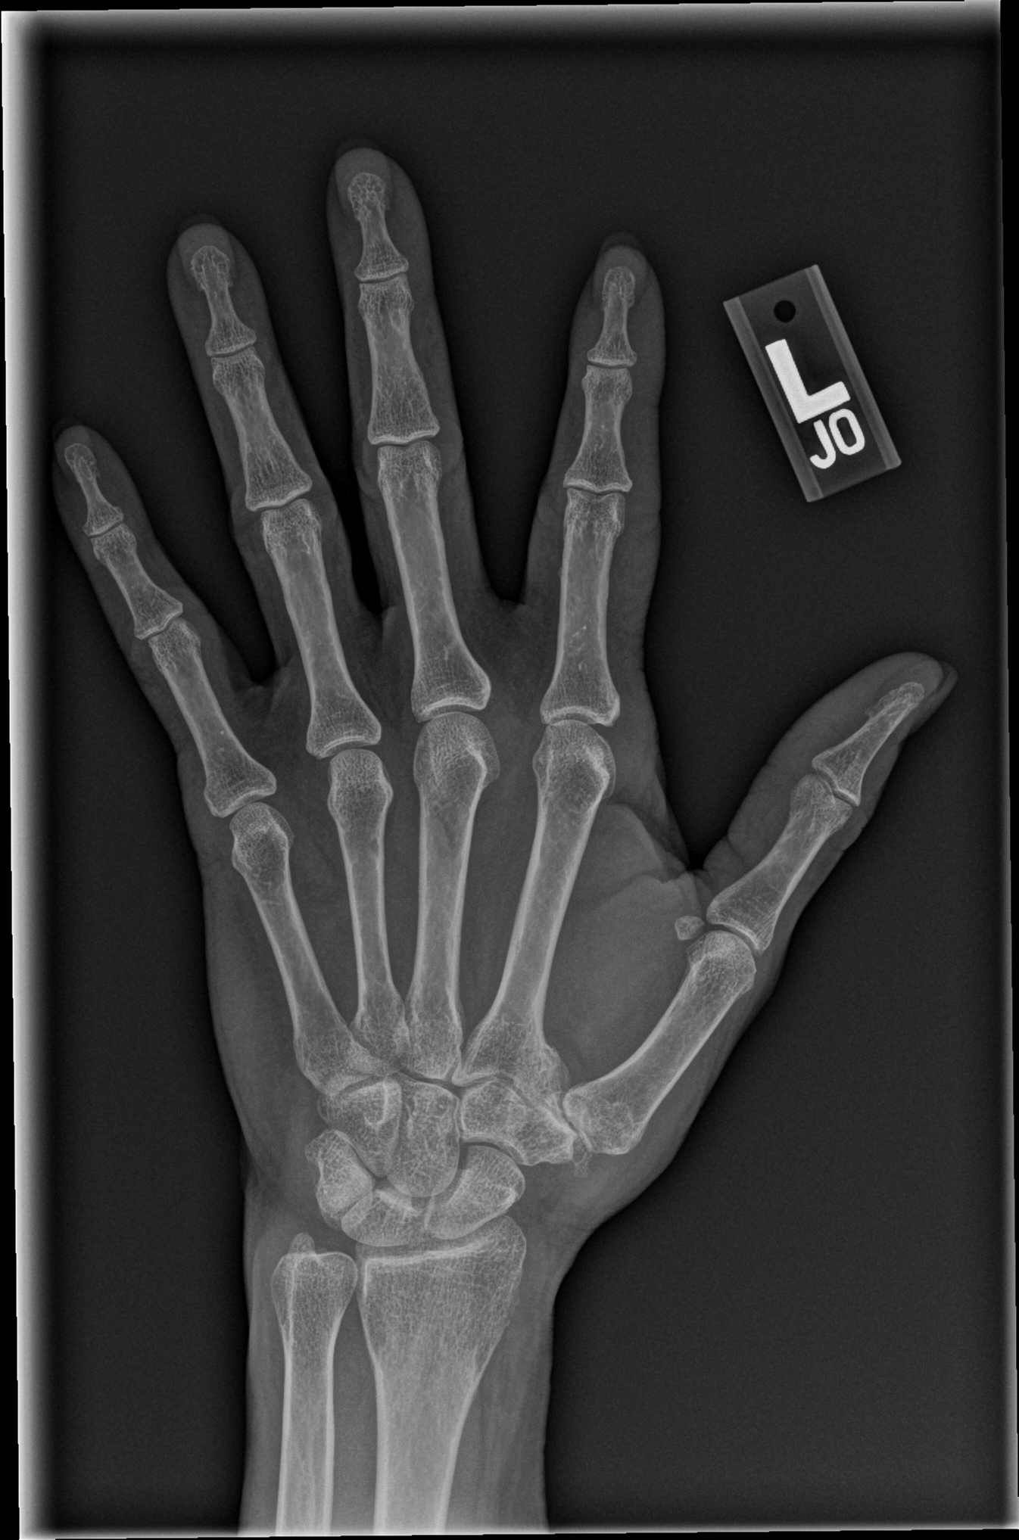

[x hand obl left]
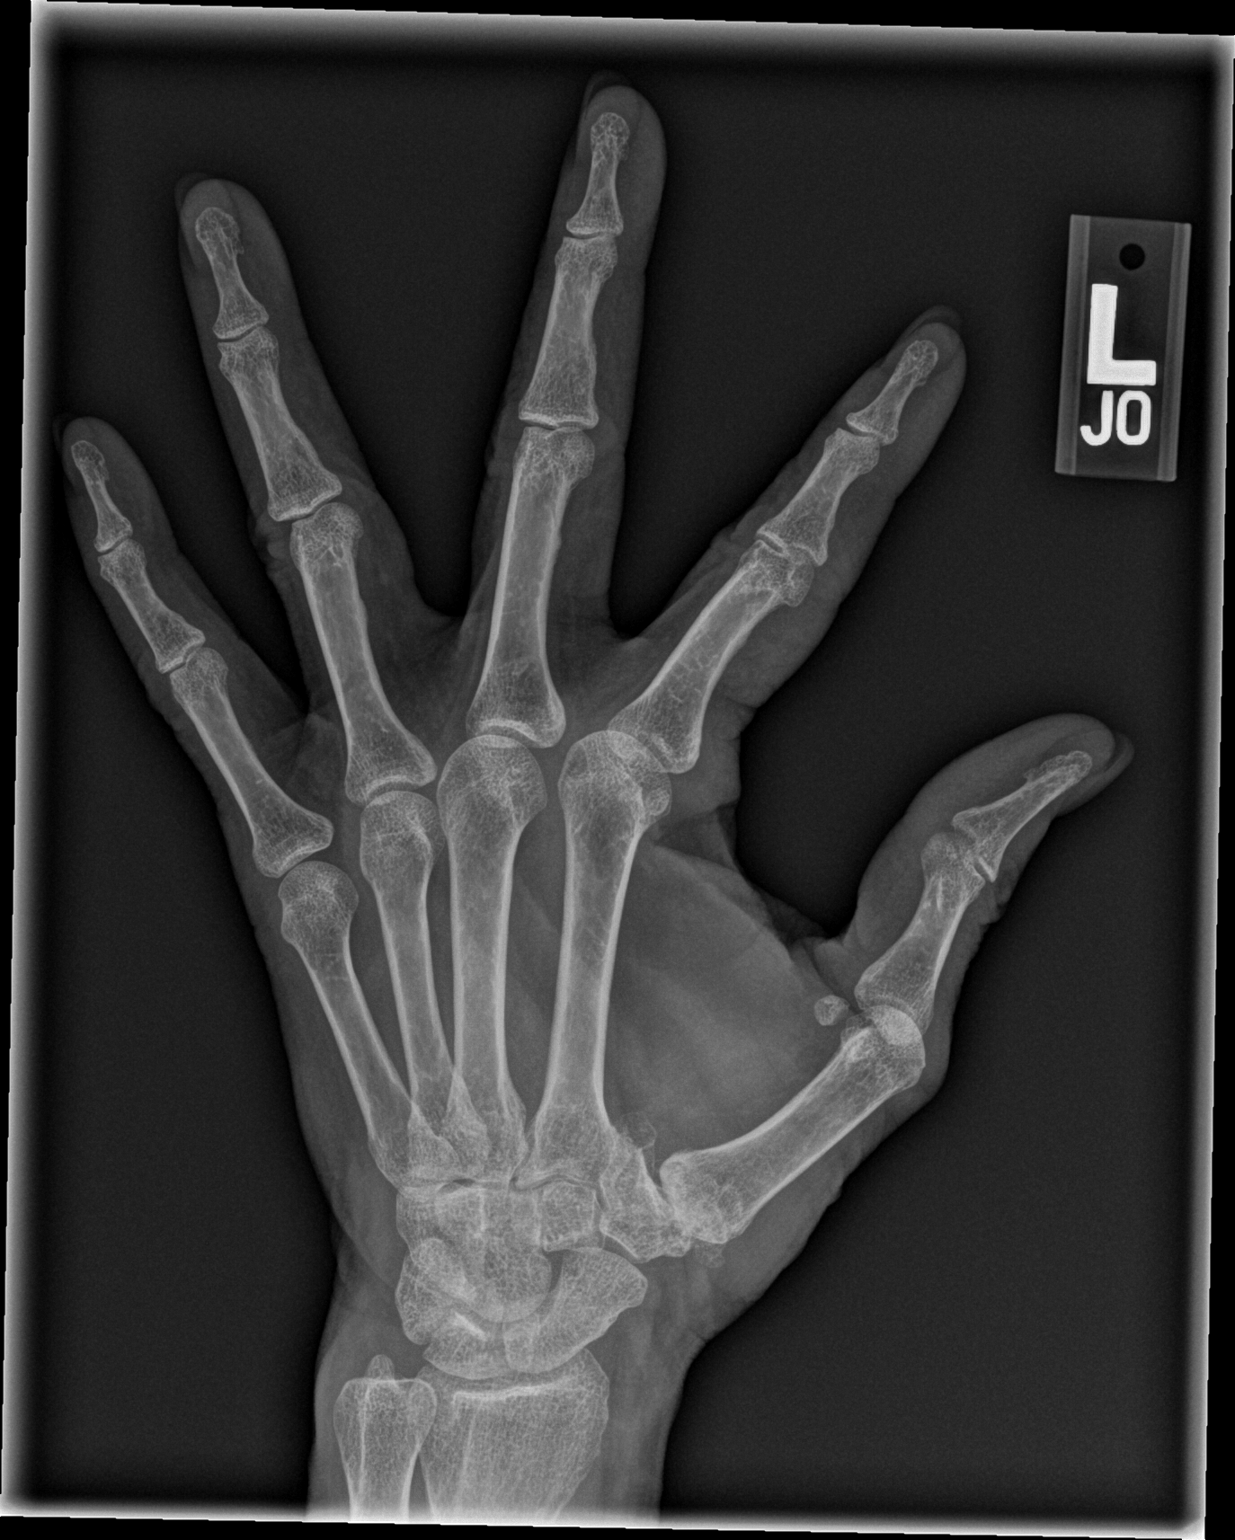

[x hand lat left]
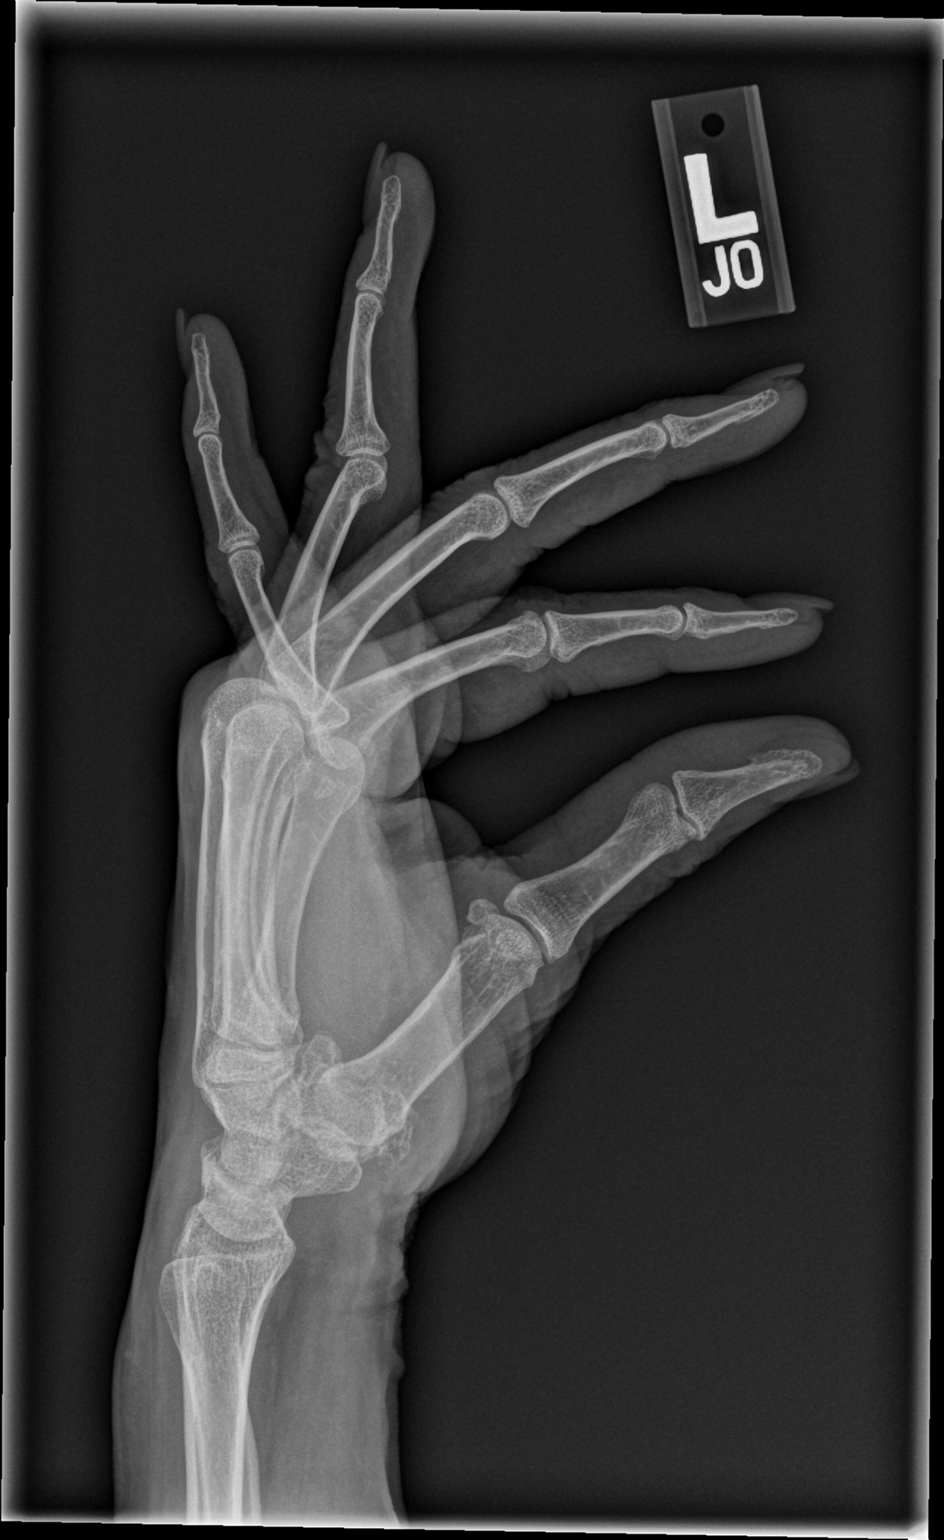

[3 of 3 positions shown; findings below may reference images not displayed]

FINDINGS: Osteoarthritic changes in the first carpometacarpal joint with joint
space narrowing and spurring. Remainder the joint spaces are
maintained. No fracture, subluxation or dislocation. No bony
erosion.
IMPRESSION: Moderate osteoarthritic changes at the first carpometacarpal joint.
No acute findings.

## 2019-03-11 ENCOUNTER — Ambulatory Visit: Payer: Medicare HMO

## 2019-03-16 ENCOUNTER — Ambulatory Visit: Payer: Medicare Other | Attending: Internal Medicine

## 2019-03-16 ENCOUNTER — Ambulatory Visit: Payer: Medicare HMO

## 2019-03-16 DIAGNOSIS — Z23 Encounter for immunization: Secondary | ICD-10-CM | POA: Insufficient documentation

## 2019-03-16 NOTE — Progress Notes (Signed)
   Covid-19 Vaccination Clinic  Name:  Regina [name]    MRN: [id number] DOB: [birth year]  03/16/2019  Regina [name] was observed post Covid-19 immunization for 15 minutes without incidence. She was provided with Vaccine Information Sheet and instruction to access the V-Safe system.   Regina [name] was instructed to call 911 with any severe reactions post vaccine: Marland Kitchen Difficulty breathing  . Swelling of your face and throat  . A fast heartbeat  . A bad rash all over your body  . Dizziness and weakness    Immunizations Administered    Name Date Dose VIS Date Route   Pfizer COVID-19 Vaccine 03/16/2019 11:22 AM 0.3 mL 01/19/2019 Intramuscular   Manufacturer: [place]   Lot: [id number]   [place]: [id number]

## 2019-04-10 ENCOUNTER — Ambulatory Visit: Payer: Medicare Other | Attending: Internal Medicine

## 2019-04-10 DIAGNOSIS — Z23 Encounter for immunization: Secondary | ICD-10-CM | POA: Insufficient documentation

## 2019-04-10 NOTE — Progress Notes (Signed)
   Covid-19 Vaccination Clinic  Name:  Regina [name]    MRN: [id number] DOB: 07-May-1940  04/10/2019  [name] was observed post Covid-19 immunization for 15 minutes without incident. She was provided with Vaccine Information Sheet and instruction to access the V-Safe system.   [name] was instructed to call 911 with any severe reactions post vaccine: Marland Kitchen Difficulty breathing  . Swelling of face and throat  . A fast heartbeat  . A bad rash all over body  . Dizziness and weakness   Immunizations Administered    Name Date Dose VIS Date Route   Pfizer COVID-19 Vaccine 04/10/2019  9:47 AM 0.3 mL 01/19/2019 Intramuscular   Manufacturer: [place]   Lot: [id number]   [place]: [id number]

## 2020-02-28 DIAGNOSIS — G4733 Obstructive sleep apnea (adult) (pediatric): Secondary | ICD-10-CM | POA: Diagnosis not present

## 2020-03-28 DIAGNOSIS — E785 Hyperlipidemia, unspecified: Secondary | ICD-10-CM | POA: Diagnosis not present

## 2020-03-28 DIAGNOSIS — I208 Other forms of angina pectoris: Secondary | ICD-10-CM | POA: Diagnosis not present

## 2020-03-28 DIAGNOSIS — R7303 Prediabetes: Secondary | ICD-10-CM | POA: Diagnosis not present

## 2020-03-28 DIAGNOSIS — I10 Essential (primary) hypertension: Secondary | ICD-10-CM | POA: Diagnosis not present

## 2020-03-28 DIAGNOSIS — I471 Supraventricular tachycardia: Secondary | ICD-10-CM | POA: Diagnosis not present

## 2020-03-31 DIAGNOSIS — E559 Vitamin D deficiency, unspecified: Secondary | ICD-10-CM | POA: Diagnosis not present

## 2020-03-31 DIAGNOSIS — R7303 Prediabetes: Secondary | ICD-10-CM | POA: Diagnosis not present

## 2020-03-31 DIAGNOSIS — I10 Essential (primary) hypertension: Secondary | ICD-10-CM | POA: Diagnosis not present

## 2020-03-31 DIAGNOSIS — E785 Hyperlipidemia, unspecified: Secondary | ICD-10-CM | POA: Diagnosis not present

## 2020-04-01 DIAGNOSIS — G4733 Obstructive sleep apnea (adult) (pediatric): Secondary | ICD-10-CM | POA: Diagnosis not present

## 2020-04-07 DIAGNOSIS — M810 Age-related osteoporosis without current pathological fracture: Secondary | ICD-10-CM | POA: Diagnosis not present

## 2020-04-07 DIAGNOSIS — E785 Hyperlipidemia, unspecified: Secondary | ICD-10-CM | POA: Diagnosis not present

## 2020-04-07 DIAGNOSIS — I10 Essential (primary) hypertension: Secondary | ICD-10-CM | POA: Diagnosis not present

## 2020-05-14 ENCOUNTER — Ambulatory Visit: Payer: Medicare Other | Attending: Internal Medicine

## 2020-05-14 DIAGNOSIS — Z23 Encounter for immunization: Secondary | ICD-10-CM

## 2020-05-14 NOTE — Progress Notes (Signed)
   Covid-19 Vaccination Clinic  Name:  Regina [name]    MRN: [id number] DOB: [birth year]  05/14/2020  [name] was observed post Covid-19 immunization for 15 minutes without incident. She was provided with Vaccine Information Sheet and instruction to access the V-Safe system.   Ms. Comp was instructed to call 911 with any severe reactions post vaccine: Marland Kitchen Difficulty breathing  . Swelling of face and throat  . A fast heartbeat  . A bad rash all over body  . Dizziness and weakness   Immunizations Administered    Name Date Dose VIS Date Route   PFIZER Comrnaty([place] TOP) Covid-19 Vaccine 05/14/2020 12:12 PM 0.3 mL 01/17/2020 Intramuscular   Manufacturer: [place]   Lot: [id number]   [place]: [phone]

## 2020-05-16 DIAGNOSIS — E785 Hyperlipidemia, unspecified: Secondary | ICD-10-CM | POA: Diagnosis not present

## 2020-05-16 DIAGNOSIS — I10 Essential (primary) hypertension: Secondary | ICD-10-CM | POA: Diagnosis not present

## 2020-05-16 DIAGNOSIS — E119 Type 2 diabetes mellitus without complications: Secondary | ICD-10-CM | POA: Diagnosis not present

## 2020-05-22 ENCOUNTER — Other Ambulatory Visit (HOSPITAL_BASED_OUTPATIENT_CLINIC_OR_DEPARTMENT_OTHER): Payer: Self-pay

## 2020-05-22 MED ORDER — COVID-19 MRNA VACCINE (PFIZER) 30 MCG/0.3ML IM SUSP
INTRAMUSCULAR | 0 refills | Status: DC
  Filled 2020-05-22: qty 0.3, 1d supply, fill #0

## 2020-06-27 DIAGNOSIS — R7303 Prediabetes: Secondary | ICD-10-CM | POA: Diagnosis not present

## 2020-06-27 DIAGNOSIS — I471 Supraventricular tachycardia: Secondary | ICD-10-CM | POA: Diagnosis not present

## 2020-06-27 DIAGNOSIS — E559 Vitamin D deficiency, unspecified: Secondary | ICD-10-CM | POA: Diagnosis not present

## 2020-06-27 DIAGNOSIS — E785 Hyperlipidemia, unspecified: Secondary | ICD-10-CM | POA: Diagnosis not present

## 2020-06-27 DIAGNOSIS — I10 Essential (primary) hypertension: Secondary | ICD-10-CM | POA: Diagnosis not present

## 2020-06-27 DIAGNOSIS — I208 Other forms of angina pectoris: Secondary | ICD-10-CM | POA: Diagnosis not present

## 2020-07-04 DIAGNOSIS — N3281 Overactive bladder: Secondary | ICD-10-CM | POA: Diagnosis not present

## 2020-07-04 DIAGNOSIS — Z0000 Encounter for general adult medical examination without abnormal findings: Secondary | ICD-10-CM | POA: Diagnosis not present

## 2020-07-04 DIAGNOSIS — Z1389 Encounter for screening for other disorder: Secondary | ICD-10-CM | POA: Diagnosis not present

## 2020-07-04 DIAGNOSIS — I10 Essential (primary) hypertension: Secondary | ICD-10-CM | POA: Diagnosis not present

## 2020-07-04 DIAGNOSIS — E785 Hyperlipidemia, unspecified: Secondary | ICD-10-CM | POA: Diagnosis not present

## 2020-07-04 DIAGNOSIS — K591 Functional diarrhea: Secondary | ICD-10-CM | POA: Diagnosis not present

## 2020-07-04 DIAGNOSIS — L989 Disorder of the skin and subcutaneous tissue, unspecified: Secondary | ICD-10-CM | POA: Diagnosis not present

## 2020-07-04 DIAGNOSIS — R21 Rash and other nonspecific skin eruption: Secondary | ICD-10-CM | POA: Diagnosis not present

## 2020-07-04 DIAGNOSIS — N8110 Cystocele, unspecified: Secondary | ICD-10-CM | POA: Diagnosis not present

## 2020-07-04 DIAGNOSIS — M8588 Other specified disorders of bone density and structure, other site: Secondary | ICD-10-CM | POA: Diagnosis not present

## 2020-09-04 ENCOUNTER — Encounter: Payer: Self-pay | Admitting: Gastroenterology

## 2020-09-05 DIAGNOSIS — Z1231 Encounter for screening mammogram for malignant neoplasm of breast: Secondary | ICD-10-CM | POA: Diagnosis not present

## 2020-09-15 DIAGNOSIS — Z961 Presence of intraocular lens: Secondary | ICD-10-CM | POA: Diagnosis not present

## 2020-10-01 DIAGNOSIS — I10 Essential (primary) hypertension: Secondary | ICD-10-CM | POA: Diagnosis not present

## 2020-10-01 DIAGNOSIS — I38 Endocarditis, valve unspecified: Secondary | ICD-10-CM | POA: Diagnosis not present

## 2020-10-01 DIAGNOSIS — R0609 Other forms of dyspnea: Secondary | ICD-10-CM | POA: Diagnosis not present

## 2020-10-01 DIAGNOSIS — E785 Hyperlipidemia, unspecified: Secondary | ICD-10-CM | POA: Diagnosis not present

## 2020-10-01 DIAGNOSIS — I471 Supraventricular tachycardia: Secondary | ICD-10-CM | POA: Diagnosis not present

## 2020-10-01 DIAGNOSIS — R7303 Prediabetes: Secondary | ICD-10-CM | POA: Diagnosis not present

## 2020-10-07 DIAGNOSIS — R0609 Other forms of dyspnea: Secondary | ICD-10-CM | POA: Diagnosis not present

## 2020-10-31 ENCOUNTER — Ambulatory Visit: Payer: Medicare Other | Admitting: Gastroenterology

## 2020-12-03 DIAGNOSIS — R42 Dizziness and giddiness: Secondary | ICD-10-CM | POA: Diagnosis not present

## 2020-12-03 DIAGNOSIS — Z23 Encounter for immunization: Secondary | ICD-10-CM | POA: Diagnosis not present

## 2020-12-03 DIAGNOSIS — R61 Generalized hyperhidrosis: Secondary | ICD-10-CM | POA: Diagnosis not present

## 2020-12-03 DIAGNOSIS — R7303 Prediabetes: Secondary | ICD-10-CM | POA: Diagnosis not present

## 2020-12-18 ENCOUNTER — Encounter: Payer: Self-pay | Admitting: Gastroenterology

## 2020-12-18 ENCOUNTER — Ambulatory Visit: Payer: Medicare Other | Admitting: Gastroenterology

## 2020-12-18 VITALS — BP 104/62 | HR 70 | Ht 65.0 in | Wt 181.5 lb

## 2020-12-18 DIAGNOSIS — R197 Diarrhea, unspecified: Secondary | ICD-10-CM | POA: Diagnosis not present

## 2020-12-18 DIAGNOSIS — Z8371 Family history of colonic polyps: Secondary | ICD-10-CM

## 2020-12-18 NOTE — Progress Notes (Signed)
History of Present Illness: This is a 80 year old female referred by Harlan Stains, MD for the evaluation of diarrhea.  Her last visit was in 2017 for frequent urgent morning diarrhea with occasional incontinence.  Her symptoms resolved in 2017 and then returned several months ago.  She primarily has urgent worsening diarrhea or urgent morning bowel movements.  She began eating Raisin Bran in the evenings and her morning stools have become more formed.  Colonoscopy in April 2016 for family history of colon polyps showed moderate diverticulosis in the sigmoid colon, descending colon, transverse colon and internal hemorrhoids.  Slightly reduced anal squeeze was noted.  Denies weight loss, abdominal pain, constipation, change in stool caliber, melena, hematochezia, nausea, vomiting, dysphagia, reflux symptoms, chest pain.     Allergies  Allergen Reactions   Other Nausea And Vomiting    MYCIN drugs   Outpatient Medications Prior to Visit  Medication Sig Dispense Refill   aspirin 81 MG tablet Take 81 mg by mouth daily.     atorvastatin (LIPITOR) 80 MG tablet Take 80 mg by mouth daily.     losartan (COZAAR) 50 MG tablet Take 50 mg by mouth daily.     metoprolol tartrate (LOPRESSOR) 25 MG tablet Take 25 mg by mouth 2 (two) times daily.     Multiple Vitamin (MULTIVITAMIN) LIQD Take 5 mLs by mouth daily.     Vitamin D, Ergocalciferol, (DRISDOL) 50000 units CAPS capsule Take 50,000 Units by mouth every 7 (seven) days.     atorvastatin (LIPITOR) 10 MG tablet Take 10 mg by mouth daily.     alendronate (FOSAMAX) 70 MG tablet Take 70 mg by mouth once a week. On an empty stomach (Patient not taking: Reported on 12/18/2020)     CALCIUM PO Take 1 tablet by mouth 2 (two) times daily. (Patient not taking: Reported on 12/18/2020)     COVID-19 mRNA vaccine, Pfizer, 30 MCG/0.3ML injection Inject into the muscle. (Patient not taking: Reported on 12/18/2020) 0.3 mL 0   ondansetron (ZOFRAN ODT) 4 MG  disintegrating tablet Take 1 tablet (4 mg total) by mouth every 8 (eight) hours as needed for nausea or vomiting. (Patient not taking: Reported on 12/18/2020) 15 tablet 0   No facility-administered medications prior to visit.   Past Medical History:  Diagnosis Date   Apnea, sleep    cpap use irregulary   Arthritis    Diverticulosis    Dysrhythmia    hx svt 04/ ablation no problms since per patient   Elevated cholesterol    Fibroid    Hypertension    IC (interstitial cystitis)    Obstructive sleep apnea    Osteopenia 08/2017   T score -1.9 FRAX 4% / 0.8%   PAD (peripheral artery disease) ([facility])    Pneumonia    hx   Vitamin D deficiency    Past Surgical History:  Procedure Laterality Date   ABDOMINAL HYSTERECTOMY     TAH LSO   APPENDECTOMY     BREAST LUMPECTOMY WITH NEEDLE LOCALIZATION Right 11/22/2012   Procedure: BREAST LUMPECTOMY WITH NEEDLE LOCALIZATION;  [place]: Harl Bowie, MD;  Location: [facility];  Service: General;  Laterality: Right;   BREAST SURGERY     Lumpectomy-Benign   BREAST SURGERY     Biopsy   CARDIAC ELECTROPHYSIOLOGY STUDY AND ABLATION     EYE SURGERY Bilateral    cat   HEMORRHOID SURGERY     OOPHORECTOMY     LSO   TONSILLECTOMY  Social History   Socioeconomic History   Marital status: Widowed    Spouse name: Not on file   Number of children: Not on file   Years of education: Not on file   Highest education level: Not on file  Occupational History   Not on file  Tobacco Use   Smoking status: Never   Smokeless tobacco: Never  Vaping Use   Vaping Use: Never used  Substance and Sexual Activity   Alcohol use: No   Drug use: No   Sexual activity: Never    Birth control/protection: Surgical  Other Topics Concern   Not on file  Social History Narrative   Not on file   Social Determinants of Health   Financial Resource Strain: Not on file  Food Insecurity: Not on file  Transportation Needs: Not on file  Physical Activity: Not on  file  Stress: Not on file  Social Connections: Not on file   Family History  Problem Relation Age of Onset   Diabetes Mother    Hypertension Mother    Stroke Mother    Cancer Father        Multiple Myloma   Cancer Daughter 12       OVARIAN CANCER STAGE 3    Colon cancer Neg Hx    Esophageal cancer Neg Hx    Pancreatic cancer Neg Hx    Stomach cancer Neg Hx       Review of Systems: Pertinent positive and negative review of systems were noted in the above HPI section. All other review of systems were otherwise negative.   Physical Exam: General: Well developed, well nourished, no acute distress Head: Normocephalic and atraumatic Eyes: Sclerae anicteric, EOMI Ears: Normal auditory acuity Mouth: Not examined, mask on during Covid-19 pandemic Neck: Supple, no masses or thyromegaly Lungs: Clear throughout to auscultation Heart: Regular rate and rhythm; no murmurs, rubs or bruits Abdomen: Soft, non tender and non distended. No masses, hepatosplenomegaly or hernias noted. Normal Bowel sounds Rectal: Not done Musculoskeletal: Symmetrical with no gross deformities  Skin: No lesions on visible extremities Pulses:  Normal pulses noted Extremities: No clubbing, cyanosis, edema or deformities noted Neurological: Alert oriented x 4, grossly nonfocal Cervical Nodes:  No significant cervical adenopathy Inguinal Nodes: No significant inguinal adenopathy Psychological:  Alert and cooperative. Normal mood and affect   Assessment and Recommendations:  Diarrhea, fecal urgency.  Symptoms occur first thing in the morning and have improved with Raison Bran.  Diarrhea sounds benign and functional.  Trial of Imodium at bedtime.  If this is not effective she is advised to call for further advice and possibly further evaluation. Family history of colon polyps, multiple first-degree relatives.  Her prior colonoscopies have not showed colon polyps.  It is reasonable to discontinue screening  colonoscopies or proceed with 1 more.  She prefers to discontinue colonoscopies.   cc: Harlan Stains, MD [address] [place],  [place] [address]

## 2020-12-18 NOTE — Patient Instructions (Signed)
You can take  over the counter Imodium at bedtime for diarrhea.   Call our office if your symptoms are not better.   The [facility] providers would like to encourage you to use [facility] to communicate with providers for non-urgent requests or questions.  Due to long hold times on the telephone, sending your provider a message by [facility] may be a faster and more efficient way to get a response.  Please allow 48 business hours for a response.  Please remember that this is for non-urgent requests.   Thank you for choosing me and [facility].  Pricilla Riffle. Dagoberto Ligas., MD., Marval Regal

## 2021-01-15 DIAGNOSIS — J208 Acute bronchitis due to other specified organisms: Secondary | ICD-10-CM | POA: Diagnosis not present

## 2021-01-24 DIAGNOSIS — R059 Cough, unspecified: Secondary | ICD-10-CM | POA: Diagnosis not present

## 2021-01-24 DIAGNOSIS — R053 Chronic cough: Secondary | ICD-10-CM | POA: Diagnosis not present

## 2021-01-24 DIAGNOSIS — Z8616 Personal history of COVID-19: Secondary | ICD-10-CM | POA: Diagnosis not present

## 2021-01-24 DIAGNOSIS — J40 Bronchitis, not specified as acute or chronic: Secondary | ICD-10-CM | POA: Diagnosis not present

## 2021-01-24 DIAGNOSIS — Z20822 Contact with and (suspected) exposure to covid-19: Secondary | ICD-10-CM | POA: Diagnosis not present

## 2021-01-24 DIAGNOSIS — I159 Secondary hypertension, unspecified: Secondary | ICD-10-CM | POA: Diagnosis not present

## 2021-03-09 DIAGNOSIS — N183 Chronic kidney disease, stage 3 unspecified: Secondary | ICD-10-CM | POA: Diagnosis not present

## 2021-03-09 DIAGNOSIS — R7303 Prediabetes: Secondary | ICD-10-CM | POA: Diagnosis not present

## 2021-03-09 DIAGNOSIS — I129 Hypertensive chronic kidney disease with stage 1 through stage 4 chronic kidney disease, or unspecified chronic kidney disease: Secondary | ICD-10-CM | POA: Diagnosis not present

## 2021-03-10 ENCOUNTER — Other Ambulatory Visit: Payer: Self-pay | Admitting: Family Medicine

## 2021-03-10 DIAGNOSIS — N183 Chronic kidney disease, stage 3 unspecified: Secondary | ICD-10-CM

## 2021-03-13 DIAGNOSIS — I38 Endocarditis, valve unspecified: Secondary | ICD-10-CM | POA: Diagnosis not present

## 2021-03-13 DIAGNOSIS — E559 Vitamin D deficiency, unspecified: Secondary | ICD-10-CM | POA: Diagnosis not present

## 2021-03-13 DIAGNOSIS — E785 Hyperlipidemia, unspecified: Secondary | ICD-10-CM | POA: Diagnosis not present

## 2021-03-13 DIAGNOSIS — I10 Essential (primary) hypertension: Secondary | ICD-10-CM | POA: Diagnosis not present

## 2021-03-13 DIAGNOSIS — I471 Supraventricular tachycardia: Secondary | ICD-10-CM | POA: Diagnosis not present

## 2021-03-13 DIAGNOSIS — R7303 Prediabetes: Secondary | ICD-10-CM | POA: Diagnosis not present

## 2021-03-13 DIAGNOSIS — I5030 Unspecified diastolic (congestive) heart failure: Secondary | ICD-10-CM | POA: Diagnosis not present

## 2021-03-17 ENCOUNTER — Ambulatory Visit
Admission: RE | Admit: 2021-03-17 | Discharge: 2021-03-17 | Disposition: A | Discharge status: Home / Self Care | Payer: Medicare Other | Source: Ambulatory Visit | Attending: Family Medicine | Admitting: Family Medicine

## 2021-03-17 DIAGNOSIS — N183 Chronic kidney disease, stage 3 unspecified: Secondary | ICD-10-CM

## 2021-03-17 DIAGNOSIS — N281 Cyst of kidney, acquired: Secondary | ICD-10-CM | POA: Diagnosis not present

## 2021-04-08 DIAGNOSIS — E785 Hyperlipidemia, unspecified: Secondary | ICD-10-CM | POA: Diagnosis not present

## 2021-04-08 DIAGNOSIS — I10 Essential (primary) hypertension: Secondary | ICD-10-CM | POA: Diagnosis not present

## 2021-04-17 DIAGNOSIS — E785 Hyperlipidemia, unspecified: Secondary | ICD-10-CM | POA: Diagnosis not present

## 2021-04-17 DIAGNOSIS — D631 Anemia in chronic kidney disease: Secondary | ICD-10-CM | POA: Diagnosis not present

## 2021-04-17 DIAGNOSIS — N2581 Secondary hyperparathyroidism of renal origin: Secondary | ICD-10-CM | POA: Diagnosis not present

## 2021-04-17 DIAGNOSIS — I129 Hypertensive chronic kidney disease with stage 1 through stage 4 chronic kidney disease, or unspecified chronic kidney disease: Secondary | ICD-10-CM | POA: Diagnosis not present

## 2021-04-17 DIAGNOSIS — N1831 Chronic kidney disease, stage 3a: Secondary | ICD-10-CM | POA: Diagnosis not present

## 2021-05-04 DIAGNOSIS — M25511 Pain in right shoulder: Secondary | ICD-10-CM | POA: Diagnosis not present

## 2021-05-04 DIAGNOSIS — M25512 Pain in left shoulder: Secondary | ICD-10-CM | POA: Diagnosis not present

## 2021-06-04 DIAGNOSIS — D1801 Hemangioma of skin and subcutaneous tissue: Secondary | ICD-10-CM | POA: Diagnosis not present

## 2021-06-04 DIAGNOSIS — D485 Neoplasm of uncertain behavior of skin: Secondary | ICD-10-CM | POA: Diagnosis not present

## 2021-06-04 DIAGNOSIS — D3617 Benign neoplasm of peripheral nerves and autonomic nervous system of trunk, unspecified: Secondary | ICD-10-CM | POA: Diagnosis not present

## 2021-07-10 DIAGNOSIS — E785 Hyperlipidemia, unspecified: Secondary | ICD-10-CM | POA: Diagnosis not present

## 2021-07-10 DIAGNOSIS — R21 Rash and other nonspecific skin eruption: Secondary | ICD-10-CM | POA: Diagnosis not present

## 2021-07-10 DIAGNOSIS — G4733 Obstructive sleep apnea (adult) (pediatric): Secondary | ICD-10-CM | POA: Diagnosis not present

## 2021-07-10 DIAGNOSIS — I129 Hypertensive chronic kidney disease with stage 1 through stage 4 chronic kidney disease, or unspecified chronic kidney disease: Secondary | ICD-10-CM | POA: Diagnosis not present

## 2021-07-10 DIAGNOSIS — R7303 Prediabetes: Secondary | ICD-10-CM | POA: Diagnosis not present

## 2021-07-10 DIAGNOSIS — N183 Chronic kidney disease, stage 3 unspecified: Secondary | ICD-10-CM | POA: Diagnosis not present

## 2021-07-10 DIAGNOSIS — N3281 Overactive bladder: Secondary | ICD-10-CM | POA: Diagnosis not present

## 2021-07-10 DIAGNOSIS — Z0000 Encounter for general adult medical examination without abnormal findings: Secondary | ICD-10-CM | POA: Diagnosis not present

## 2021-09-07 DIAGNOSIS — Z1231 Encounter for screening mammogram for malignant neoplasm of breast: Secondary | ICD-10-CM | POA: Diagnosis not present

## 2021-09-18 DIAGNOSIS — I5030 Unspecified diastolic (congestive) heart failure: Secondary | ICD-10-CM | POA: Diagnosis not present

## 2021-09-18 DIAGNOSIS — I471 Supraventricular tachycardia: Secondary | ICD-10-CM | POA: Diagnosis not present

## 2021-09-18 DIAGNOSIS — I38 Endocarditis, valve unspecified: Secondary | ICD-10-CM | POA: Diagnosis not present

## 2021-09-18 DIAGNOSIS — I10 Essential (primary) hypertension: Secondary | ICD-10-CM | POA: Diagnosis not present

## 2021-10-26 DIAGNOSIS — Z961 Presence of intraocular lens: Secondary | ICD-10-CM | POA: Diagnosis not present

## 2021-10-26 DIAGNOSIS — H40013 Open angle with borderline findings, low risk, bilateral: Secondary | ICD-10-CM | POA: Diagnosis not present

## 2021-12-11 DIAGNOSIS — J029 Acute pharyngitis, unspecified: Secondary | ICD-10-CM | POA: Diagnosis not present

## 2021-12-11 DIAGNOSIS — R059 Cough, unspecified: Secondary | ICD-10-CM | POA: Diagnosis not present

## 2021-12-11 DIAGNOSIS — J069 Acute upper respiratory infection, unspecified: Secondary | ICD-10-CM | POA: Diagnosis not present

## 2021-12-11 DIAGNOSIS — Z03818 Encounter for observation for suspected exposure to other biological agents ruled out: Secondary | ICD-10-CM | POA: Diagnosis not present

## 2021-12-11 DIAGNOSIS — R0602 Shortness of breath: Secondary | ICD-10-CM | POA: Diagnosis not present

## 2022-01-11 DIAGNOSIS — I129 Hypertensive chronic kidney disease with stage 1 through stage 4 chronic kidney disease, or unspecified chronic kidney disease: Secondary | ICD-10-CM | POA: Diagnosis not present

## 2022-01-11 DIAGNOSIS — N183 Chronic kidney disease, stage 3 unspecified: Secondary | ICD-10-CM | POA: Diagnosis not present

## 2022-01-11 DIAGNOSIS — R7303 Prediabetes: Secondary | ICD-10-CM | POA: Diagnosis not present

## 2022-01-18 DIAGNOSIS — L02412 Cutaneous abscess of left axilla: Secondary | ICD-10-CM | POA: Diagnosis not present

## 2022-02-25 DIAGNOSIS — M25552 Pain in left hip: Secondary | ICD-10-CM | POA: Diagnosis not present

## 2022-03-11 DIAGNOSIS — M25552 Pain in left hip: Secondary | ICD-10-CM | POA: Diagnosis not present

## 2022-03-18 DIAGNOSIS — M25552 Pain in left hip: Secondary | ICD-10-CM | POA: Diagnosis not present

## 2022-03-22 DIAGNOSIS — M25552 Pain in left hip: Secondary | ICD-10-CM | POA: Diagnosis not present

## 2022-03-25 DIAGNOSIS — M25552 Pain in left hip: Secondary | ICD-10-CM | POA: Diagnosis not present

## 2022-03-30 DIAGNOSIS — M25552 Pain in left hip: Secondary | ICD-10-CM | POA: Diagnosis not present

## 2022-04-01 DIAGNOSIS — M25552 Pain in left hip: Secondary | ICD-10-CM | POA: Diagnosis not present

## 2022-04-08 DIAGNOSIS — M25552 Pain in left hip: Secondary | ICD-10-CM | POA: Diagnosis not present

## 2022-04-13 DIAGNOSIS — M25552 Pain in left hip: Secondary | ICD-10-CM | POA: Diagnosis not present

## 2022-04-23 DIAGNOSIS — M25552 Pain in left hip: Secondary | ICD-10-CM | POA: Diagnosis not present

## 2022-05-31 DIAGNOSIS — M5410 Radiculopathy, site unspecified: Secondary | ICD-10-CM | POA: Diagnosis not present

## 2022-05-31 DIAGNOSIS — Z9989 Dependence on other enabling machines and devices: Secondary | ICD-10-CM | POA: Diagnosis not present

## 2022-06-11 DIAGNOSIS — M5451 Vertebrogenic low back pain: Secondary | ICD-10-CM | POA: Diagnosis not present

## 2022-06-11 DIAGNOSIS — M25551 Pain in right hip: Secondary | ICD-10-CM | POA: Diagnosis not present

## 2022-06-11 DIAGNOSIS — M5459 Other low back pain: Secondary | ICD-10-CM | POA: Diagnosis not present

## 2022-06-25 DIAGNOSIS — M5451 Vertebrogenic low back pain: Secondary | ICD-10-CM | POA: Diagnosis not present

## 2022-07-01 DIAGNOSIS — N183 Chronic kidney disease, stage 3 unspecified: Secondary | ICD-10-CM | POA: Diagnosis not present

## 2022-07-01 DIAGNOSIS — I129 Hypertensive chronic kidney disease with stage 1 through stage 4 chronic kidney disease, or unspecified chronic kidney disease: Secondary | ICD-10-CM | POA: Diagnosis not present

## 2022-07-01 DIAGNOSIS — E785 Hyperlipidemia, unspecified: Secondary | ICD-10-CM | POA: Diagnosis not present

## 2022-07-02 DIAGNOSIS — M5136 Other intervertebral disc degeneration, lumbar region: Secondary | ICD-10-CM | POA: Diagnosis not present

## 2022-07-02 DIAGNOSIS — M5451 Vertebrogenic low back pain: Secondary | ICD-10-CM | POA: Diagnosis not present

## 2022-07-15 DIAGNOSIS — M5416 Radiculopathy, lumbar region: Secondary | ICD-10-CM | POA: Diagnosis not present

## 2022-07-28 DIAGNOSIS — R7303 Prediabetes: Secondary | ICD-10-CM | POA: Diagnosis not present

## 2022-07-28 DIAGNOSIS — I10 Essential (primary) hypertension: Secondary | ICD-10-CM | POA: Diagnosis not present

## 2022-07-28 DIAGNOSIS — E559 Vitamin D deficiency, unspecified: Secondary | ICD-10-CM | POA: Diagnosis not present

## 2022-07-28 DIAGNOSIS — E785 Hyperlipidemia, unspecified: Secondary | ICD-10-CM | POA: Diagnosis not present

## 2022-07-29 DIAGNOSIS — M5416 Radiculopathy, lumbar region: Secondary | ICD-10-CM | POA: Diagnosis not present

## 2022-08-10 DIAGNOSIS — M5416 Radiculopathy, lumbar region: Secondary | ICD-10-CM | POA: Diagnosis not present

## 2022-08-20 DIAGNOSIS — M5451 Vertebrogenic low back pain: Secondary | ICD-10-CM | POA: Diagnosis not present

## 2022-08-20 DIAGNOSIS — M5136 Other intervertebral disc degeneration, lumbar region: Secondary | ICD-10-CM | POA: Diagnosis not present

## 2022-09-06 DIAGNOSIS — E785 Hyperlipidemia, unspecified: Secondary | ICD-10-CM | POA: Diagnosis not present

## 2022-09-06 DIAGNOSIS — M8588 Other specified disorders of bone density and structure, other site: Secondary | ICD-10-CM | POA: Diagnosis not present

## 2022-09-06 DIAGNOSIS — G4733 Obstructive sleep apnea (adult) (pediatric): Secondary | ICD-10-CM | POA: Diagnosis not present

## 2022-09-06 DIAGNOSIS — Z0000 Encounter for general adult medical examination without abnormal findings: Secondary | ICD-10-CM | POA: Diagnosis not present

## 2022-09-06 DIAGNOSIS — N183 Chronic kidney disease, stage 3 unspecified: Secondary | ICD-10-CM | POA: Diagnosis not present

## 2022-09-06 DIAGNOSIS — R350 Frequency of micturition: Secondary | ICD-10-CM | POA: Diagnosis not present

## 2022-09-06 DIAGNOSIS — I129 Hypertensive chronic kidney disease with stage 1 through stage 4 chronic kidney disease, or unspecified chronic kidney disease: Secondary | ICD-10-CM | POA: Diagnosis not present

## 2022-09-06 DIAGNOSIS — Z9989 Dependence on other enabling machines and devices: Secondary | ICD-10-CM | POA: Diagnosis not present

## 2022-09-06 DIAGNOSIS — R7303 Prediabetes: Secondary | ICD-10-CM | POA: Diagnosis not present

## 2022-09-13 DIAGNOSIS — M8588 Other specified disorders of bone density and structure, other site: Secondary | ICD-10-CM | POA: Diagnosis not present

## 2022-09-13 DIAGNOSIS — Z1231 Encounter for screening mammogram for malignant neoplasm of breast: Secondary | ICD-10-CM | POA: Diagnosis not present

## 2022-09-13 DIAGNOSIS — R29890 Loss of height: Secondary | ICD-10-CM | POA: Diagnosis not present

## 2022-09-16 DIAGNOSIS — N6489 Other specified disorders of breast: Secondary | ICD-10-CM | POA: Diagnosis not present

## 2022-09-16 DIAGNOSIS — N6002 Solitary cyst of left breast: Secondary | ICD-10-CM | POA: Diagnosis not present

## 2022-09-16 DIAGNOSIS — R922 Inconclusive mammogram: Secondary | ICD-10-CM | POA: Diagnosis not present

## 2023-01-17 DIAGNOSIS — I471 Supraventricular tachycardia, unspecified: Secondary | ICD-10-CM | POA: Diagnosis not present

## 2023-01-17 DIAGNOSIS — R7303 Prediabetes: Secondary | ICD-10-CM | POA: Diagnosis not present

## 2023-01-17 DIAGNOSIS — E559 Vitamin D deficiency, unspecified: Secondary | ICD-10-CM | POA: Diagnosis not present

## 2023-01-17 DIAGNOSIS — E785 Hyperlipidemia, unspecified: Secondary | ICD-10-CM | POA: Diagnosis not present

## 2023-01-17 DIAGNOSIS — I10 Essential (primary) hypertension: Secondary | ICD-10-CM | POA: Diagnosis not present

## 2023-05-07 DIAGNOSIS — Z03818 Encounter for observation for suspected exposure to other biological agents ruled out: Secondary | ICD-10-CM | POA: Diagnosis not present

## 2023-05-07 DIAGNOSIS — J069 Acute upper respiratory infection, unspecified: Secondary | ICD-10-CM | POA: Diagnosis not present

## 2023-05-07 DIAGNOSIS — R051 Acute cough: Secondary | ICD-10-CM | POA: Diagnosis not present

## 2023-05-12 DIAGNOSIS — N183 Chronic kidney disease, stage 3 unspecified: Secondary | ICD-10-CM | POA: Diagnosis not present

## 2023-05-12 DIAGNOSIS — J069 Acute upper respiratory infection, unspecified: Secondary | ICD-10-CM | POA: Diagnosis not present

## 2023-05-17 DIAGNOSIS — N183 Chronic kidney disease, stage 3 unspecified: Secondary | ICD-10-CM | POA: Diagnosis not present

## 2023-05-17 DIAGNOSIS — J069 Acute upper respiratory infection, unspecified: Secondary | ICD-10-CM | POA: Diagnosis not present

## 2023-05-17 DIAGNOSIS — R0981 Nasal congestion: Secondary | ICD-10-CM | POA: Diagnosis not present

## 2023-05-17 DIAGNOSIS — B9689 Other specified bacterial agents as the cause of diseases classified elsewhere: Secondary | ICD-10-CM | POA: Diagnosis not present

## 2023-08-16 DIAGNOSIS — N183 Chronic kidney disease, stage 3 unspecified: Secondary | ICD-10-CM | POA: Diagnosis not present

## 2023-08-16 DIAGNOSIS — I129 Hypertensive chronic kidney disease with stage 1 through stage 4 chronic kidney disease, or unspecified chronic kidney disease: Secondary | ICD-10-CM | POA: Diagnosis not present

## 2023-08-16 DIAGNOSIS — R252 Cramp and spasm: Secondary | ICD-10-CM | POA: Diagnosis not present

## 2023-08-16 DIAGNOSIS — M791 Myalgia, unspecified site: Secondary | ICD-10-CM | POA: Diagnosis not present

## 2023-09-01 DIAGNOSIS — R748 Abnormal levels of other serum enzymes: Secondary | ICD-10-CM | POA: Diagnosis not present

## 2023-09-14 DIAGNOSIS — Z1231 Encounter for screening mammogram for malignant neoplasm of breast: Secondary | ICD-10-CM | POA: Diagnosis not present

## 2023-09-26 DIAGNOSIS — M79641 Pain in right hand: Secondary | ICD-10-CM | POA: Diagnosis not present

## 2023-09-26 DIAGNOSIS — M79642 Pain in left hand: Secondary | ICD-10-CM | POA: Diagnosis not present

## 2023-09-26 DIAGNOSIS — M25551 Pain in right hip: Secondary | ICD-10-CM | POA: Diagnosis not present

## 2023-09-26 DIAGNOSIS — M25552 Pain in left hip: Secondary | ICD-10-CM | POA: Diagnosis not present

## 2023-09-26 DIAGNOSIS — R748 Abnormal levels of other serum enzymes: Secondary | ICD-10-CM | POA: Diagnosis not present

## 2023-09-26 DIAGNOSIS — R252 Cramp and spasm: Secondary | ICD-10-CM | POA: Diagnosis not present

## 2023-10-05 DIAGNOSIS — M7062 Trochanteric bursitis, left hip: Secondary | ICD-10-CM | POA: Diagnosis not present

## 2023-10-31 DIAGNOSIS — I129 Hypertensive chronic kidney disease with stage 1 through stage 4 chronic kidney disease, or unspecified chronic kidney disease: Secondary | ICD-10-CM | POA: Diagnosis not present

## 2023-10-31 DIAGNOSIS — R748 Abnormal levels of other serum enzymes: Secondary | ICD-10-CM | POA: Diagnosis not present

## 2023-10-31 DIAGNOSIS — G4733 Obstructive sleep apnea (adult) (pediatric): Secondary | ICD-10-CM | POA: Diagnosis not present

## 2023-10-31 DIAGNOSIS — M8588 Other specified disorders of bone density and structure, other site: Secondary | ICD-10-CM | POA: Diagnosis not present

## 2023-10-31 DIAGNOSIS — R7303 Prediabetes: Secondary | ICD-10-CM | POA: Diagnosis not present

## 2023-10-31 DIAGNOSIS — Z23 Encounter for immunization: Secondary | ICD-10-CM | POA: Diagnosis not present

## 2023-10-31 DIAGNOSIS — E785 Hyperlipidemia, unspecified: Secondary | ICD-10-CM | POA: Diagnosis not present

## 2023-10-31 DIAGNOSIS — N183 Chronic kidney disease, stage 3 unspecified: Secondary | ICD-10-CM | POA: Diagnosis not present

## 2023-10-31 DIAGNOSIS — H9193 Unspecified hearing loss, bilateral: Secondary | ICD-10-CM | POA: Diagnosis not present

## 2023-10-31 DIAGNOSIS — Z0000 Encounter for general adult medical examination without abnormal findings: Secondary | ICD-10-CM | POA: Diagnosis not present

## 2023-11-24 DIAGNOSIS — G4733 Obstructive sleep apnea (adult) (pediatric): Secondary | ICD-10-CM | POA: Diagnosis not present
